# Patient Record
Sex: Female | Born: 1935 | Race: White | Hispanic: No | Marital: Single | Smoking: Never smoker
Health system: Southern US, Community
[De-identification: ages and names within clinical notes are randomized; demographics above are authoritative.]

## PROBLEM LIST (undated history)

## (undated) DIAGNOSIS — K5792 Diverticulitis of intestine, part unspecified, without perforation or abscess without bleeding: Secondary | ICD-10-CM

## (undated) DIAGNOSIS — M5430 Sciatica, unspecified side: Secondary | ICD-10-CM

## (undated) DIAGNOSIS — K5190 Ulcerative colitis, unspecified, without complications: Secondary | ICD-10-CM

## (undated) DIAGNOSIS — I10 Essential (primary) hypertension: Secondary | ICD-10-CM

## (undated) DIAGNOSIS — C801 Malignant (primary) neoplasm, unspecified: Secondary | ICD-10-CM

## (undated) DIAGNOSIS — L719 Rosacea, unspecified: Secondary | ICD-10-CM

## (undated) DIAGNOSIS — N200 Calculus of kidney: Secondary | ICD-10-CM

## (undated) HISTORY — DX: Malignant (primary) neoplasm, unspecified: C80.1

## (undated) HISTORY — PX: ABDOMINAL HYSTERECTOMY: SHX81

## (undated) HISTORY — PX: EYE SURGERY: SHX253

## (undated) HISTORY — PX: MASTECTOMY: SHX3

## (undated) HISTORY — PX: BREAST SURGERY: SHX581

---

## 2006-03-04 ENCOUNTER — Ambulatory Visit: Payer: Self-pay | Admitting: Gastroenterology

## 2006-12-09 ENCOUNTER — Ambulatory Visit: Payer: Self-pay | Admitting: Family Medicine

## 2007-11-24 ENCOUNTER — Ambulatory Visit: Payer: Self-pay | Admitting: Family Medicine

## 2007-12-23 ENCOUNTER — Ambulatory Visit: Payer: Self-pay | Admitting: Family Medicine

## 2008-02-10 ENCOUNTER — Emergency Department: Payer: Self-pay | Admitting: Emergency Medicine

## 2008-09-13 ENCOUNTER — Ambulatory Visit: Payer: Self-pay | Admitting: Internal Medicine

## 2008-10-11 ENCOUNTER — Inpatient Hospital Stay: Payer: Self-pay | Admitting: Internal Medicine

## 2008-12-24 ENCOUNTER — Ambulatory Visit: Payer: Self-pay | Admitting: Family Medicine

## 2009-02-25 ENCOUNTER — Ambulatory Visit: Payer: Self-pay | Admitting: Family Medicine

## 2009-05-10 ENCOUNTER — Ambulatory Visit: Payer: Self-pay | Admitting: Family Medicine

## 2009-07-30 ENCOUNTER — Encounter: Payer: Self-pay | Admitting: Orthopedic Surgery

## 2009-08-07 ENCOUNTER — Encounter: Payer: Self-pay | Admitting: Orthopedic Surgery

## 2009-09-07 ENCOUNTER — Encounter: Payer: Self-pay | Admitting: Orthopedic Surgery

## 2009-10-08 ENCOUNTER — Encounter: Payer: Self-pay | Admitting: Orthopedic Surgery

## 2009-11-05 ENCOUNTER — Encounter: Payer: Self-pay | Admitting: Orthopedic Surgery

## 2009-12-06 ENCOUNTER — Encounter: Payer: Self-pay | Admitting: Orthopedic Surgery

## 2009-12-14 ENCOUNTER — Ambulatory Visit: Payer: Self-pay | Admitting: Family Medicine

## 2010-01-05 ENCOUNTER — Encounter: Payer: Self-pay | Admitting: Orthopedic Surgery

## 2010-08-13 ENCOUNTER — Other Ambulatory Visit: Payer: Self-pay | Admitting: Physician Assistant

## 2010-08-26 ENCOUNTER — Other Ambulatory Visit: Payer: Self-pay | Admitting: Gastroenterology

## 2011-05-07 ENCOUNTER — Ambulatory Visit: Payer: Self-pay | Admitting: Family Medicine

## 2011-09-09 DIAGNOSIS — M9981 Other biomechanical lesions of cervical region: Secondary | ICD-10-CM | POA: Diagnosis not present

## 2011-09-09 DIAGNOSIS — M729 Fibroblastic disorder, unspecified: Secondary | ICD-10-CM | POA: Diagnosis not present

## 2011-09-09 DIAGNOSIS — M542 Cervicalgia: Secondary | ICD-10-CM | POA: Diagnosis not present

## 2011-09-09 DIAGNOSIS — M999 Biomechanical lesion, unspecified: Secondary | ICD-10-CM | POA: Diagnosis not present

## 2011-09-18 ENCOUNTER — Ambulatory Visit: Payer: Self-pay

## 2011-09-18 DIAGNOSIS — S59909A Unspecified injury of unspecified elbow, initial encounter: Secondary | ICD-10-CM | POA: Diagnosis not present

## 2011-09-18 DIAGNOSIS — S6990XA Unspecified injury of unspecified wrist, hand and finger(s), initial encounter: Secondary | ICD-10-CM | POA: Diagnosis not present

## 2011-09-19 ENCOUNTER — Ambulatory Visit: Payer: Self-pay

## 2011-09-25 DIAGNOSIS — R259 Unspecified abnormal involuntary movements: Secondary | ICD-10-CM | POA: Diagnosis not present

## 2011-09-25 DIAGNOSIS — Z853 Personal history of malignant neoplasm of breast: Secondary | ICD-10-CM | POA: Diagnosis not present

## 2011-09-25 DIAGNOSIS — R269 Unspecified abnormalities of gait and mobility: Secondary | ICD-10-CM | POA: Diagnosis not present

## 2011-09-25 DIAGNOSIS — M542 Cervicalgia: Secondary | ICD-10-CM | POA: Diagnosis not present

## 2011-09-25 DIAGNOSIS — M9981 Other biomechanical lesions of cervical region: Secondary | ICD-10-CM | POA: Diagnosis not present

## 2011-09-25 DIAGNOSIS — M999 Biomechanical lesion, unspecified: Secondary | ICD-10-CM | POA: Diagnosis not present

## 2011-09-25 DIAGNOSIS — Z79899 Other long term (current) drug therapy: Secondary | ICD-10-CM | POA: Diagnosis not present

## 2011-09-25 DIAGNOSIS — M729 Fibroblastic disorder, unspecified: Secondary | ICD-10-CM | POA: Diagnosis not present

## 2011-09-25 DIAGNOSIS — I69919 Unspecified symptoms and signs involving cognitive functions following unspecified cerebrovascular disease: Secondary | ICD-10-CM | POA: Diagnosis not present

## 2011-09-25 DIAGNOSIS — G250 Essential tremor: Secondary | ICD-10-CM | POA: Diagnosis not present

## 2011-09-26 DIAGNOSIS — M899 Disorder of bone, unspecified: Secondary | ICD-10-CM | POA: Diagnosis not present

## 2011-09-26 DIAGNOSIS — C50919 Malignant neoplasm of unspecified site of unspecified female breast: Secondary | ICD-10-CM | POA: Diagnosis not present

## 2011-09-26 DIAGNOSIS — Z79899 Other long term (current) drug therapy: Secondary | ICD-10-CM | POA: Diagnosis not present

## 2011-09-26 DIAGNOSIS — I10 Essential (primary) hypertension: Secondary | ICD-10-CM | POA: Diagnosis not present

## 2011-09-26 DIAGNOSIS — I890 Lymphedema, not elsewhere classified: Secondary | ICD-10-CM | POA: Diagnosis not present

## 2011-09-26 DIAGNOSIS — Z5181 Encounter for therapeutic drug level monitoring: Secondary | ICD-10-CM | POA: Diagnosis not present

## 2011-09-26 DIAGNOSIS — M949 Disorder of cartilage, unspecified: Secondary | ICD-10-CM | POA: Diagnosis not present

## 2011-09-26 DIAGNOSIS — Z09 Encounter for follow-up examination after completed treatment for conditions other than malignant neoplasm: Secondary | ICD-10-CM | POA: Diagnosis not present

## 2011-09-26 DIAGNOSIS — S51809A Unspecified open wound of unspecified forearm, initial encounter: Secondary | ICD-10-CM | POA: Diagnosis not present

## 2011-09-30 DIAGNOSIS — L0390 Cellulitis, unspecified: Secondary | ICD-10-CM | POA: Diagnosis not present

## 2011-09-30 DIAGNOSIS — S51809A Unspecified open wound of unspecified forearm, initial encounter: Secondary | ICD-10-CM | POA: Diagnosis not present

## 2011-09-30 DIAGNOSIS — M949 Disorder of cartilage, unspecified: Secondary | ICD-10-CM | POA: Diagnosis not present

## 2011-09-30 DIAGNOSIS — S41109A Unspecified open wound of unspecified upper arm, initial encounter: Secondary | ICD-10-CM | POA: Diagnosis not present

## 2011-09-30 DIAGNOSIS — L0291 Cutaneous abscess, unspecified: Secondary | ICD-10-CM | POA: Diagnosis not present

## 2011-10-14 ENCOUNTER — Ambulatory Visit: Payer: Self-pay | Admitting: Family Medicine

## 2011-10-14 DIAGNOSIS — Z79899 Other long term (current) drug therapy: Secondary | ICD-10-CM | POA: Diagnosis not present

## 2011-10-14 DIAGNOSIS — I10 Essential (primary) hypertension: Secondary | ICD-10-CM | POA: Diagnosis not present

## 2011-10-14 LAB — LIPID PANEL
Cholesterol: 186 mg/dL (ref 0–200)
HDL Cholesterol: 69 mg/dL — ABNORMAL HIGH (ref 40–60)
Triglycerides: 121 mg/dL (ref 0–200)
VLDL Cholesterol, Calc: 24 mg/dL (ref 5–40)

## 2011-10-14 LAB — CREATININE, SERUM
Creatinine: 1.09 mg/dL (ref 0.60–1.30)
EGFR (African American): >60

## 2011-10-14 LAB — ELECTROLYTE PANEL
Anion Gap: 7 (ref 7–16)
Co2: 33 mmol/L — ABNORMAL HIGH (ref 21–32)

## 2011-10-14 LAB — BUN: BUN: 19 mg/dL — ABNORMAL HIGH (ref 7–18)

## 2011-11-03 ENCOUNTER — Ambulatory Visit: Payer: Self-pay | Admitting: Family Medicine

## 2011-11-03 DIAGNOSIS — M899 Disorder of bone, unspecified: Secondary | ICD-10-CM | POA: Diagnosis not present

## 2011-11-03 DIAGNOSIS — N951 Menopausal and female climacteric states: Secondary | ICD-10-CM | POA: Diagnosis not present

## 2011-11-03 DIAGNOSIS — M810 Age-related osteoporosis without current pathological fracture: Secondary | ICD-10-CM | POA: Diagnosis not present

## 2011-11-05 DIAGNOSIS — D320 Benign neoplasm of cerebral meninges: Secondary | ICD-10-CM | POA: Diagnosis not present

## 2011-11-05 DIAGNOSIS — G250 Essential tremor: Secondary | ICD-10-CM | POA: Diagnosis not present

## 2011-11-05 DIAGNOSIS — M4802 Spinal stenosis, cervical region: Secondary | ICD-10-CM | POA: Diagnosis not present

## 2011-11-05 DIAGNOSIS — G252 Other specified forms of tremor: Secondary | ICD-10-CM | POA: Diagnosis not present

## 2011-11-05 DIAGNOSIS — G219 Secondary parkinsonism, unspecified: Secondary | ICD-10-CM | POA: Diagnosis not present

## 2011-12-03 DIAGNOSIS — M546 Pain in thoracic spine: Secondary | ICD-10-CM | POA: Diagnosis not present

## 2011-12-03 DIAGNOSIS — M729 Fibroblastic disorder, unspecified: Secondary | ICD-10-CM | POA: Diagnosis not present

## 2011-12-03 DIAGNOSIS — M999 Biomechanical lesion, unspecified: Secondary | ICD-10-CM | POA: Diagnosis not present

## 2011-12-24 DIAGNOSIS — G3184 Mild cognitive impairment, so stated: Secondary | ICD-10-CM | POA: Diagnosis not present

## 2011-12-24 DIAGNOSIS — R259 Unspecified abnormal involuntary movements: Secondary | ICD-10-CM | POA: Diagnosis not present

## 2011-12-24 DIAGNOSIS — R269 Unspecified abnormalities of gait and mobility: Secondary | ICD-10-CM | POA: Diagnosis not present

## 2011-12-25 DIAGNOSIS — M729 Fibroblastic disorder, unspecified: Secondary | ICD-10-CM | POA: Diagnosis not present

## 2011-12-25 DIAGNOSIS — M546 Pain in thoracic spine: Secondary | ICD-10-CM | POA: Diagnosis not present

## 2011-12-25 DIAGNOSIS — M999 Biomechanical lesion, unspecified: Secondary | ICD-10-CM | POA: Diagnosis not present

## 2012-01-07 DIAGNOSIS — M546 Pain in thoracic spine: Secondary | ICD-10-CM | POA: Diagnosis not present

## 2012-01-07 DIAGNOSIS — M999 Biomechanical lesion, unspecified: Secondary | ICD-10-CM | POA: Diagnosis not present

## 2012-01-07 DIAGNOSIS — M729 Fibroblastic disorder, unspecified: Secondary | ICD-10-CM | POA: Diagnosis not present

## 2012-01-11 DIAGNOSIS — H353190 Nonexudative age-related macular degeneration, unspecified eye, stage unspecified: Secondary | ICD-10-CM | POA: Diagnosis not present

## 2012-01-11 DIAGNOSIS — H16149 Punctate keratitis, unspecified eye: Secondary | ICD-10-CM | POA: Diagnosis not present

## 2012-01-27 DIAGNOSIS — M546 Pain in thoracic spine: Secondary | ICD-10-CM | POA: Diagnosis not present

## 2012-01-27 DIAGNOSIS — M729 Fibroblastic disorder, unspecified: Secondary | ICD-10-CM | POA: Diagnosis not present

## 2012-01-27 DIAGNOSIS — M999 Biomechanical lesion, unspecified: Secondary | ICD-10-CM | POA: Diagnosis not present

## 2012-01-29 DIAGNOSIS — C50919 Malignant neoplasm of unspecified site of unspecified female breast: Secondary | ICD-10-CM | POA: Diagnosis not present

## 2012-01-29 DIAGNOSIS — N390 Urinary tract infection, site not specified: Secondary | ICD-10-CM | POA: Diagnosis not present

## 2012-01-29 DIAGNOSIS — R5381 Other malaise: Secondary | ICD-10-CM | POA: Diagnosis not present

## 2012-01-29 DIAGNOSIS — M549 Dorsalgia, unspecified: Secondary | ICD-10-CM | POA: Diagnosis not present

## 2012-02-08 DIAGNOSIS — L0390 Cellulitis, unspecified: Secondary | ICD-10-CM | POA: Diagnosis not present

## 2012-02-08 DIAGNOSIS — L0291 Cutaneous abscess, unspecified: Secondary | ICD-10-CM | POA: Diagnosis not present

## 2012-02-11 DIAGNOSIS — M545 Low back pain: Secondary | ICD-10-CM | POA: Diagnosis not present

## 2012-02-11 DIAGNOSIS — M999 Biomechanical lesion, unspecified: Secondary | ICD-10-CM | POA: Diagnosis not present

## 2012-02-11 DIAGNOSIS — M729 Fibroblastic disorder, unspecified: Secondary | ICD-10-CM | POA: Diagnosis not present

## 2012-03-01 DIAGNOSIS — M729 Fibroblastic disorder, unspecified: Secondary | ICD-10-CM | POA: Diagnosis not present

## 2012-03-01 DIAGNOSIS — M999 Biomechanical lesion, unspecified: Secondary | ICD-10-CM | POA: Diagnosis not present

## 2012-03-01 DIAGNOSIS — M545 Low back pain: Secondary | ICD-10-CM | POA: Diagnosis not present

## 2012-03-25 DIAGNOSIS — M729 Fibroblastic disorder, unspecified: Secondary | ICD-10-CM | POA: Diagnosis not present

## 2012-03-25 DIAGNOSIS — M545 Low back pain: Secondary | ICD-10-CM | POA: Diagnosis not present

## 2012-03-25 DIAGNOSIS — M999 Biomechanical lesion, unspecified: Secondary | ICD-10-CM | POA: Diagnosis not present

## 2012-04-18 DIAGNOSIS — M729 Fibroblastic disorder, unspecified: Secondary | ICD-10-CM | POA: Diagnosis not present

## 2012-04-18 DIAGNOSIS — M999 Biomechanical lesion, unspecified: Secondary | ICD-10-CM | POA: Diagnosis not present

## 2012-04-18 DIAGNOSIS — IMO0002 Reserved for concepts with insufficient information to code with codable children: Secondary | ICD-10-CM | POA: Diagnosis not present

## 2012-05-11 DIAGNOSIS — C50919 Malignant neoplasm of unspecified site of unspecified female breast: Secondary | ICD-10-CM | POA: Diagnosis not present

## 2012-05-16 ENCOUNTER — Ambulatory Visit: Payer: Self-pay | Admitting: Family Medicine

## 2012-05-16 DIAGNOSIS — I10 Essential (primary) hypertension: Secondary | ICD-10-CM | POA: Diagnosis not present

## 2012-05-16 LAB — RENAL FUNCTION PANEL
Albumin: 3.7 g/dL (ref 3.4–5.0)
BUN: 24 mg/dL — ABNORMAL HIGH (ref 7–18)
Calcium, Total: 9.7 mg/dL (ref 8.5–10.1)
Chloride: 103 mmol/L (ref 98–107)
Co2: 34 mmol/L — ABNORMAL HIGH (ref 21–32)
Creatinine: 1.23 mg/dL (ref 0.60–1.30)
EGFR (Non-African Amer.): 39 — ABNORMAL LOW
Glucose: 105 mg/dL — ABNORMAL HIGH (ref 65–99)
Osmolality: 286 (ref 275–301)
Phosphorus: 3.1 mg/dL (ref 2.5–4.9)

## 2012-06-16 DIAGNOSIS — Z23 Encounter for immunization: Secondary | ICD-10-CM | POA: Diagnosis not present

## 2012-06-21 DIAGNOSIS — M999 Biomechanical lesion, unspecified: Secondary | ICD-10-CM | POA: Diagnosis not present

## 2012-06-21 DIAGNOSIS — M729 Fibroblastic disorder, unspecified: Secondary | ICD-10-CM | POA: Diagnosis not present

## 2012-06-21 DIAGNOSIS — M546 Pain in thoracic spine: Secondary | ICD-10-CM | POA: Diagnosis not present

## 2012-06-29 DIAGNOSIS — L259 Unspecified contact dermatitis, unspecified cause: Secondary | ICD-10-CM | POA: Diagnosis not present

## 2012-06-29 DIAGNOSIS — L723 Sebaceous cyst: Secondary | ICD-10-CM | POA: Diagnosis not present

## 2012-06-29 DIAGNOSIS — L570 Actinic keratosis: Secondary | ICD-10-CM | POA: Diagnosis not present

## 2012-06-29 DIAGNOSIS — L219 Seborrheic dermatitis, unspecified: Secondary | ICD-10-CM | POA: Diagnosis not present

## 2012-07-06 DIAGNOSIS — M999 Biomechanical lesion, unspecified: Secondary | ICD-10-CM | POA: Diagnosis not present

## 2012-07-06 DIAGNOSIS — M729 Fibroblastic disorder, unspecified: Secondary | ICD-10-CM | POA: Diagnosis not present

## 2012-07-06 DIAGNOSIS — M546 Pain in thoracic spine: Secondary | ICD-10-CM | POA: Diagnosis not present

## 2012-07-14 DIAGNOSIS — H1045 Other chronic allergic conjunctivitis: Secondary | ICD-10-CM | POA: Diagnosis not present

## 2012-07-15 DIAGNOSIS — R29818 Other symptoms and signs involving the nervous system: Secondary | ICD-10-CM | POA: Diagnosis not present

## 2012-07-15 DIAGNOSIS — R262 Difficulty in walking, not elsewhere classified: Secondary | ICD-10-CM | POA: Diagnosis not present

## 2012-07-21 DIAGNOSIS — H1045 Other chronic allergic conjunctivitis: Secondary | ICD-10-CM | POA: Diagnosis not present

## 2012-07-27 DIAGNOSIS — L259 Unspecified contact dermatitis, unspecified cause: Secondary | ICD-10-CM | POA: Diagnosis not present

## 2012-07-27 DIAGNOSIS — B379 Candidiasis, unspecified: Secondary | ICD-10-CM | POA: Diagnosis not present

## 2012-07-27 DIAGNOSIS — N390 Urinary tract infection, site not specified: Secondary | ICD-10-CM | POA: Diagnosis not present

## 2012-07-29 DIAGNOSIS — R29818 Other symptoms and signs involving the nervous system: Secondary | ICD-10-CM | POA: Diagnosis not present

## 2012-07-29 DIAGNOSIS — R262 Difficulty in walking, not elsewhere classified: Secondary | ICD-10-CM | POA: Diagnosis not present

## 2012-08-09 DIAGNOSIS — R262 Difficulty in walking, not elsewhere classified: Secondary | ICD-10-CM | POA: Diagnosis not present

## 2012-08-09 DIAGNOSIS — R29818 Other symptoms and signs involving the nervous system: Secondary | ICD-10-CM | POA: Diagnosis not present

## 2012-08-16 DIAGNOSIS — R262 Difficulty in walking, not elsewhere classified: Secondary | ICD-10-CM | POA: Diagnosis not present

## 2012-08-16 DIAGNOSIS — R29818 Other symptoms and signs involving the nervous system: Secondary | ICD-10-CM | POA: Diagnosis not present

## 2012-09-13 DIAGNOSIS — R262 Difficulty in walking, not elsewhere classified: Secondary | ICD-10-CM | POA: Diagnosis not present

## 2012-09-13 DIAGNOSIS — R29818 Other symptoms and signs involving the nervous system: Secondary | ICD-10-CM | POA: Diagnosis not present

## 2012-09-20 DIAGNOSIS — H35369 Drusen (degenerative) of macula, unspecified eye: Secondary | ICD-10-CM | POA: Diagnosis not present

## 2012-09-22 DIAGNOSIS — L989 Disorder of the skin and subcutaneous tissue, unspecified: Secondary | ICD-10-CM | POA: Diagnosis not present

## 2012-09-22 DIAGNOSIS — C50919 Malignant neoplasm of unspecified site of unspecified female breast: Secondary | ICD-10-CM | POA: Diagnosis not present

## 2012-10-06 DIAGNOSIS — L600 Ingrowing nail: Secondary | ICD-10-CM | POA: Diagnosis not present

## 2012-10-06 DIAGNOSIS — M79609 Pain in unspecified limb: Secondary | ICD-10-CM | POA: Diagnosis not present

## 2012-10-06 DIAGNOSIS — B351 Tinea unguium: Secondary | ICD-10-CM | POA: Diagnosis not present

## 2012-10-17 DIAGNOSIS — R079 Chest pain, unspecified: Secondary | ICD-10-CM | POA: Diagnosis not present

## 2012-10-17 DIAGNOSIS — R4189 Other symptoms and signs involving cognitive functions and awareness: Secondary | ICD-10-CM | POA: Diagnosis not present

## 2012-10-17 DIAGNOSIS — G5790 Unspecified mononeuropathy of unspecified lower limb: Secondary | ICD-10-CM | POA: Diagnosis not present

## 2012-10-19 DIAGNOSIS — G603 Idiopathic progressive neuropathy: Secondary | ICD-10-CM | POA: Diagnosis not present

## 2012-10-19 DIAGNOSIS — R413 Other amnesia: Secondary | ICD-10-CM | POA: Diagnosis not present

## 2012-10-25 DIAGNOSIS — R209 Unspecified disturbances of skin sensation: Secondary | ICD-10-CM | POA: Diagnosis not present

## 2012-11-01 DIAGNOSIS — Z1283 Encounter for screening for malignant neoplasm of skin: Secondary | ICD-10-CM | POA: Diagnosis not present

## 2012-11-01 DIAGNOSIS — L988 Other specified disorders of the skin and subcutaneous tissue: Secondary | ICD-10-CM | POA: Diagnosis not present

## 2012-11-01 DIAGNOSIS — Z85828 Personal history of other malignant neoplasm of skin: Secondary | ICD-10-CM | POA: Diagnosis not present

## 2012-11-01 DIAGNOSIS — G9009 Other idiopathic peripheral autonomic neuropathy: Secondary | ICD-10-CM | POA: Diagnosis not present

## 2013-01-18 DIAGNOSIS — G609 Hereditary and idiopathic neuropathy, unspecified: Secondary | ICD-10-CM | POA: Diagnosis not present

## 2013-02-09 DIAGNOSIS — L219 Seborrheic dermatitis, unspecified: Secondary | ICD-10-CM | POA: Diagnosis not present

## 2013-02-09 DIAGNOSIS — L570 Actinic keratosis: Secondary | ICD-10-CM | POA: Diagnosis not present

## 2013-02-09 DIAGNOSIS — L259 Unspecified contact dermatitis, unspecified cause: Secondary | ICD-10-CM | POA: Diagnosis not present

## 2013-03-16 DIAGNOSIS — G608 Other hereditary and idiopathic neuropathies: Secondary | ICD-10-CM | POA: Diagnosis not present

## 2013-03-16 DIAGNOSIS — R5381 Other malaise: Secondary | ICD-10-CM | POA: Diagnosis not present

## 2013-03-16 DIAGNOSIS — G609 Hereditary and idiopathic neuropathy, unspecified: Secondary | ICD-10-CM | POA: Diagnosis not present

## 2013-03-20 DIAGNOSIS — M899 Disorder of bone, unspecified: Secondary | ICD-10-CM | POA: Diagnosis not present

## 2013-03-20 DIAGNOSIS — C50919 Malignant neoplasm of unspecified site of unspecified female breast: Secondary | ICD-10-CM | POA: Diagnosis not present

## 2013-03-30 DIAGNOSIS — M999 Biomechanical lesion, unspecified: Secondary | ICD-10-CM | POA: Diagnosis not present

## 2013-03-30 DIAGNOSIS — M729 Fibroblastic disorder, unspecified: Secondary | ICD-10-CM | POA: Diagnosis not present

## 2013-03-30 DIAGNOSIS — IMO0002 Reserved for concepts with insufficient information to code with codable children: Secondary | ICD-10-CM | POA: Diagnosis not present

## 2013-04-03 ENCOUNTER — Ambulatory Visit: Payer: Self-pay | Admitting: Emergency Medicine

## 2013-04-03 DIAGNOSIS — Z79899 Other long term (current) drug therapy: Secondary | ICD-10-CM | POA: Diagnosis not present

## 2013-04-03 DIAGNOSIS — S81009A Unspecified open wound, unspecified knee, initial encounter: Secondary | ICD-10-CM | POA: Diagnosis not present

## 2013-04-03 DIAGNOSIS — I10 Essential (primary) hypertension: Secondary | ICD-10-CM | POA: Diagnosis not present

## 2013-04-03 DIAGNOSIS — S81809A Unspecified open wound, unspecified lower leg, initial encounter: Secondary | ICD-10-CM | POA: Diagnosis not present

## 2013-04-21 DIAGNOSIS — M999 Biomechanical lesion, unspecified: Secondary | ICD-10-CM | POA: Diagnosis not present

## 2013-04-21 DIAGNOSIS — M729 Fibroblastic disorder, unspecified: Secondary | ICD-10-CM | POA: Diagnosis not present

## 2013-04-21 DIAGNOSIS — IMO0002 Reserved for concepts with insufficient information to code with codable children: Secondary | ICD-10-CM | POA: Diagnosis not present

## 2013-05-10 DIAGNOSIS — IMO0002 Reserved for concepts with insufficient information to code with codable children: Secondary | ICD-10-CM | POA: Diagnosis not present

## 2013-05-10 DIAGNOSIS — M999 Biomechanical lesion, unspecified: Secondary | ICD-10-CM | POA: Diagnosis not present

## 2013-05-10 DIAGNOSIS — M729 Fibroblastic disorder, unspecified: Secondary | ICD-10-CM | POA: Diagnosis not present

## 2013-05-19 ENCOUNTER — Ambulatory Visit: Payer: Self-pay | Admitting: Family Medicine

## 2013-05-19 DIAGNOSIS — I10 Essential (primary) hypertension: Secondary | ICD-10-CM | POA: Diagnosis not present

## 2013-05-19 DIAGNOSIS — N390 Urinary tract infection, site not specified: Secondary | ICD-10-CM | POA: Diagnosis not present

## 2013-05-19 LAB — RENAL FUNCTION PANEL
Anion Gap: 7 (ref 7–16)
BUN: 15 mg/dL (ref 7–18)
Co2: 32 mmol/L (ref 21–32)
Creatinine: 1.01 mg/dL (ref 0.60–1.30)
Glucose: 88 mg/dL (ref 65–99)
Osmolality: 287 (ref 275–301)

## 2013-06-08 DIAGNOSIS — M999 Biomechanical lesion, unspecified: Secondary | ICD-10-CM | POA: Diagnosis not present

## 2013-06-08 DIAGNOSIS — M729 Fibroblastic disorder, unspecified: Secondary | ICD-10-CM | POA: Diagnosis not present

## 2013-06-08 DIAGNOSIS — M545 Low back pain: Secondary | ICD-10-CM | POA: Diagnosis not present

## 2013-06-09 DIAGNOSIS — Z23 Encounter for immunization: Secondary | ICD-10-CM | POA: Diagnosis not present

## 2013-07-13 DIAGNOSIS — M729 Fibroblastic disorder, unspecified: Secondary | ICD-10-CM | POA: Diagnosis not present

## 2013-07-13 DIAGNOSIS — M545 Low back pain: Secondary | ICD-10-CM | POA: Diagnosis not present

## 2013-07-13 DIAGNOSIS — M999 Biomechanical lesion, unspecified: Secondary | ICD-10-CM | POA: Diagnosis not present

## 2013-07-21 DIAGNOSIS — M545 Low back pain: Secondary | ICD-10-CM | POA: Diagnosis not present

## 2013-07-21 DIAGNOSIS — M549 Dorsalgia, unspecified: Secondary | ICD-10-CM | POA: Diagnosis not present

## 2013-07-25 ENCOUNTER — Ambulatory Visit: Payer: Self-pay | Admitting: Orthopedic Surgery

## 2013-07-25 DIAGNOSIS — M545 Low back pain, unspecified: Secondary | ICD-10-CM | POA: Diagnosis not present

## 2013-08-01 DIAGNOSIS — R262 Difficulty in walking, not elsewhere classified: Secondary | ICD-10-CM | POA: Diagnosis not present

## 2013-08-01 DIAGNOSIS — M48061 Spinal stenosis, lumbar region without neurogenic claudication: Secondary | ICD-10-CM | POA: Diagnosis not present

## 2013-08-07 DIAGNOSIS — M48061 Spinal stenosis, lumbar region without neurogenic claudication: Secondary | ICD-10-CM | POA: Diagnosis not present

## 2013-08-07 DIAGNOSIS — R262 Difficulty in walking, not elsewhere classified: Secondary | ICD-10-CM | POA: Diagnosis not present

## 2013-08-08 DIAGNOSIS — M48061 Spinal stenosis, lumbar region without neurogenic claudication: Secondary | ICD-10-CM | POA: Diagnosis not present

## 2013-08-08 DIAGNOSIS — R262 Difficulty in walking, not elsewhere classified: Secondary | ICD-10-CM | POA: Diagnosis not present

## 2013-08-11 DIAGNOSIS — R262 Difficulty in walking, not elsewhere classified: Secondary | ICD-10-CM | POA: Diagnosis not present

## 2013-08-11 DIAGNOSIS — M48061 Spinal stenosis, lumbar region without neurogenic claudication: Secondary | ICD-10-CM | POA: Diagnosis not present

## 2013-08-17 DIAGNOSIS — M48061 Spinal stenosis, lumbar region without neurogenic claudication: Secondary | ICD-10-CM | POA: Diagnosis not present

## 2013-08-17 DIAGNOSIS — R262 Difficulty in walking, not elsewhere classified: Secondary | ICD-10-CM | POA: Diagnosis not present

## 2013-08-21 DIAGNOSIS — R262 Difficulty in walking, not elsewhere classified: Secondary | ICD-10-CM | POA: Diagnosis not present

## 2013-08-21 DIAGNOSIS — M48061 Spinal stenosis, lumbar region without neurogenic claudication: Secondary | ICD-10-CM | POA: Diagnosis not present

## 2013-08-24 DIAGNOSIS — R262 Difficulty in walking, not elsewhere classified: Secondary | ICD-10-CM | POA: Diagnosis not present

## 2013-08-24 DIAGNOSIS — M48061 Spinal stenosis, lumbar region without neurogenic claudication: Secondary | ICD-10-CM | POA: Diagnosis not present

## 2013-08-28 DIAGNOSIS — M48061 Spinal stenosis, lumbar region without neurogenic claudication: Secondary | ICD-10-CM | POA: Diagnosis not present

## 2013-08-28 DIAGNOSIS — R262 Difficulty in walking, not elsewhere classified: Secondary | ICD-10-CM | POA: Diagnosis not present

## 2013-09-27 DIAGNOSIS — Z9010 Acquired absence of unspecified breast and nipple: Secondary | ICD-10-CM | POA: Diagnosis not present

## 2013-09-27 DIAGNOSIS — M949 Disorder of cartilage, unspecified: Secondary | ICD-10-CM | POA: Diagnosis not present

## 2013-09-27 DIAGNOSIS — R413 Other amnesia: Secondary | ICD-10-CM | POA: Diagnosis not present

## 2013-09-27 DIAGNOSIS — R259 Unspecified abnormal involuntary movements: Secondary | ICD-10-CM | POA: Diagnosis not present

## 2013-09-27 DIAGNOSIS — Z170 Estrogen receptor positive status [ER+]: Secondary | ICD-10-CM | POA: Diagnosis not present

## 2013-09-27 DIAGNOSIS — M549 Dorsalgia, unspecified: Secondary | ICD-10-CM | POA: Diagnosis not present

## 2013-09-27 DIAGNOSIS — R3915 Urgency of urination: Secondary | ICD-10-CM | POA: Diagnosis not present

## 2013-09-27 DIAGNOSIS — M899 Disorder of bone, unspecified: Secondary | ICD-10-CM | POA: Diagnosis not present

## 2013-09-27 DIAGNOSIS — G20 Parkinson's disease: Secondary | ICD-10-CM | POA: Diagnosis not present

## 2013-09-27 DIAGNOSIS — N3941 Urge incontinence: Secondary | ICD-10-CM | POA: Diagnosis not present

## 2013-09-27 DIAGNOSIS — C50919 Malignant neoplasm of unspecified site of unspecified female breast: Secondary | ICD-10-CM | POA: Diagnosis not present

## 2013-09-27 DIAGNOSIS — Z79899 Other long term (current) drug therapy: Secondary | ICD-10-CM | POA: Diagnosis not present

## 2013-10-04 DIAGNOSIS — Z9010 Acquired absence of unspecified breast and nipple: Secondary | ICD-10-CM | POA: Diagnosis not present

## 2013-10-04 DIAGNOSIS — C50919 Malignant neoplasm of unspecified site of unspecified female breast: Secondary | ICD-10-CM | POA: Diagnosis not present

## 2013-10-04 DIAGNOSIS — Z170 Estrogen receptor positive status [ER+]: Secondary | ICD-10-CM | POA: Diagnosis not present

## 2013-10-04 DIAGNOSIS — N3941 Urge incontinence: Secondary | ICD-10-CM | POA: Diagnosis not present

## 2013-10-04 DIAGNOSIS — Z853 Personal history of malignant neoplasm of breast: Secondary | ICD-10-CM | POA: Diagnosis not present

## 2013-10-04 DIAGNOSIS — M549 Dorsalgia, unspecified: Secondary | ICD-10-CM | POA: Diagnosis not present

## 2013-10-04 DIAGNOSIS — R413 Other amnesia: Secondary | ICD-10-CM | POA: Diagnosis not present

## 2013-10-04 DIAGNOSIS — R935 Abnormal findings on diagnostic imaging of other abdominal regions, including retroperitoneum: Secondary | ICD-10-CM | POA: Diagnosis not present

## 2013-10-25 DIAGNOSIS — L2089 Other atopic dermatitis: Secondary | ICD-10-CM | POA: Diagnosis not present

## 2013-10-25 DIAGNOSIS — G9009 Other idiopathic peripheral autonomic neuropathy: Secondary | ICD-10-CM | POA: Diagnosis not present

## 2013-10-30 ENCOUNTER — Ambulatory Visit: Payer: Self-pay | Admitting: Physician Assistant

## 2013-10-30 DIAGNOSIS — M533 Sacrococcygeal disorders, not elsewhere classified: Secondary | ICD-10-CM | POA: Diagnosis not present

## 2013-10-30 DIAGNOSIS — S322XXA Fracture of coccyx, initial encounter for closed fracture: Secondary | ICD-10-CM | POA: Diagnosis not present

## 2013-10-30 DIAGNOSIS — S3210XA Unspecified fracture of sacrum, initial encounter for closed fracture: Secondary | ICD-10-CM | POA: Diagnosis not present

## 2013-10-30 DIAGNOSIS — Z79899 Other long term (current) drug therapy: Secondary | ICD-10-CM | POA: Diagnosis not present

## 2013-10-30 DIAGNOSIS — IMO0002 Reserved for concepts with insufficient information to code with codable children: Secondary | ICD-10-CM | POA: Diagnosis not present

## 2013-10-30 DIAGNOSIS — I10 Essential (primary) hypertension: Secondary | ICD-10-CM | POA: Diagnosis not present

## 2013-11-07 DIAGNOSIS — IMO0002 Reserved for concepts with insufficient information to code with codable children: Secondary | ICD-10-CM | POA: Diagnosis not present

## 2013-11-23 DIAGNOSIS — M1610 Unilateral primary osteoarthritis, unspecified hip: Secondary | ICD-10-CM | POA: Diagnosis not present

## 2013-11-23 DIAGNOSIS — M169 Osteoarthritis of hip, unspecified: Secondary | ICD-10-CM | POA: Diagnosis not present

## 2013-11-23 DIAGNOSIS — Z853 Personal history of malignant neoplasm of breast: Secondary | ICD-10-CM | POA: Diagnosis not present

## 2013-11-23 DIAGNOSIS — M549 Dorsalgia, unspecified: Secondary | ICD-10-CM | POA: Diagnosis not present

## 2013-11-23 DIAGNOSIS — M47814 Spondylosis without myelopathy or radiculopathy, thoracic region: Secondary | ICD-10-CM | POA: Diagnosis not present

## 2013-11-23 DIAGNOSIS — M25859 Other specified joint disorders, unspecified hip: Secondary | ICD-10-CM | POA: Diagnosis not present

## 2013-11-23 DIAGNOSIS — C50919 Malignant neoplasm of unspecified site of unspecified female breast: Secondary | ICD-10-CM | POA: Diagnosis not present

## 2013-11-23 DIAGNOSIS — R937 Abnormal findings on diagnostic imaging of other parts of musculoskeletal system: Secondary | ICD-10-CM | POA: Diagnosis not present

## 2013-11-23 DIAGNOSIS — M25559 Pain in unspecified hip: Secondary | ICD-10-CM | POA: Diagnosis not present

## 2013-11-23 DIAGNOSIS — R3915 Urgency of urination: Secondary | ICD-10-CM | POA: Diagnosis not present

## 2013-12-13 DIAGNOSIS — N3941 Urge incontinence: Secondary | ICD-10-CM | POA: Diagnosis not present

## 2013-12-13 DIAGNOSIS — N318 Other neuromuscular dysfunction of bladder: Secondary | ICD-10-CM | POA: Diagnosis not present

## 2013-12-28 DIAGNOSIS — R21 Rash and other nonspecific skin eruption: Secondary | ICD-10-CM | POA: Diagnosis not present

## 2013-12-28 DIAGNOSIS — L723 Sebaceous cyst: Secondary | ICD-10-CM | POA: Diagnosis not present

## 2013-12-28 DIAGNOSIS — G9009 Other idiopathic peripheral autonomic neuropathy: Secondary | ICD-10-CM | POA: Diagnosis not present

## 2013-12-28 DIAGNOSIS — L299 Pruritus, unspecified: Secondary | ICD-10-CM | POA: Diagnosis not present

## 2013-12-28 DIAGNOSIS — B079 Viral wart, unspecified: Secondary | ICD-10-CM | POA: Diagnosis not present

## 2014-01-02 ENCOUNTER — Ambulatory Visit (INDEPENDENT_AMBULATORY_CARE_PROVIDER_SITE_OTHER): Payer: Medicare Other | Admitting: Podiatry

## 2014-01-02 ENCOUNTER — Encounter: Payer: Self-pay | Admitting: Podiatry

## 2014-01-02 VITALS — Resp 16 | Ht 59.0 in | Wt 118.0 lb

## 2014-01-02 DIAGNOSIS — L600 Ingrowing nail: Secondary | ICD-10-CM | POA: Diagnosis not present

## 2014-01-02 NOTE — Progress Notes (Signed)
Subjective:     Patient ID: Tammy Mcdaniel, female   DOB: 1922-05-19, 80 y.o.   MRN: [id number]  HPI patient states I have a painful ingrown toenail on my right big toe that I try to trim and soak without relief and I need to have it removed like the other foot   Review of Systems  All other systems reviewed and are negative.      Objective:   Physical Exam  Nursing note and vitals reviewed. Constitutional: She is oriented to person, place, and time.  Cardiovascular: Intact distal pulses.   Musculoskeletal: Normal range of motion.  Neurological: She is oriented to person, place, and time.  Skin: Skin is warm.   neurovascular status intact with muscle strength adequate subtalar midtarsal joint and normal range of motion. Digits are well perfused and the hallux right lateral border is incurvated and sore when pressed with a damaged nail bed    Assessment:     Ingrown toenail right hallux lateral border with pain    Plan:     Advised on correction and explained the risk of procedure. Patient wants surgery and today I infiltrated 60 mg Xylocaine Marcaine mixture remove the lateral border of the exposed matrix and applied chemical consisting of phenol 3 applications 30 seconds followed by alcohol lavaged and sterile dressing. Gave instructions on soaks and reappoint

## 2014-01-02 NOTE — Patient Instructions (Signed)

## 2014-01-02 NOTE — Progress Notes (Signed)
   Subjective:    Patient ID: Tammy Mcdaniel, female    DOB: 09/18/1921, 80 y.o.   MRN: [id number]  HPI Comments: N pain L great toenail rt D 3 m O slowly C worse A bumping toe, narrow shoes T pt cuts toenails      Review of Systems  Constitutional: Positive for fatigue.  HENT: Positive for hearing loss.   Eyes: Positive for visual disturbance.  Respiratory: Positive for cough.   Gastrointestinal: Positive for constipation.  Genitourinary: Positive for urgency and frequency.  Musculoskeletal: Positive for back pain.  Allergic/Immunologic: Positive for food allergies.  Neurological: Positive for tremors.  Hematological: Bruises/bleeds easily.  All other systems reviewed and are negative.      Objective:   Physical Exam        Assessment & Plan:

## 2014-01-24 DIAGNOSIS — M999 Biomechanical lesion, unspecified: Secondary | ICD-10-CM | POA: Diagnosis not present

## 2014-01-24 DIAGNOSIS — M546 Pain in thoracic spine: Secondary | ICD-10-CM | POA: Diagnosis not present

## 2014-01-24 DIAGNOSIS — M729 Fibroblastic disorder, unspecified: Secondary | ICD-10-CM | POA: Diagnosis not present

## 2014-02-08 DIAGNOSIS — I10 Essential (primary) hypertension: Secondary | ICD-10-CM | POA: Diagnosis not present

## 2014-02-08 DIAGNOSIS — L218 Other seborrheic dermatitis: Secondary | ICD-10-CM | POA: Diagnosis not present

## 2014-02-09 DIAGNOSIS — M546 Pain in thoracic spine: Secondary | ICD-10-CM | POA: Diagnosis not present

## 2014-02-09 DIAGNOSIS — M729 Fibroblastic disorder, unspecified: Secondary | ICD-10-CM | POA: Diagnosis not present

## 2014-02-09 DIAGNOSIS — M999 Biomechanical lesion, unspecified: Secondary | ICD-10-CM | POA: Diagnosis not present

## 2014-03-05 DIAGNOSIS — L723 Sebaceous cyst: Secondary | ICD-10-CM | POA: Diagnosis not present

## 2014-03-05 DIAGNOSIS — M729 Fibroblastic disorder, unspecified: Secondary | ICD-10-CM | POA: Diagnosis not present

## 2014-03-05 DIAGNOSIS — M999 Biomechanical lesion, unspecified: Secondary | ICD-10-CM | POA: Diagnosis not present

## 2014-03-05 DIAGNOSIS — L219 Seborrheic dermatitis, unspecified: Secondary | ICD-10-CM | POA: Diagnosis not present

## 2014-03-05 DIAGNOSIS — IMO0002 Reserved for concepts with insufficient information to code with codable children: Secondary | ICD-10-CM | POA: Diagnosis not present

## 2014-03-22 DIAGNOSIS — M999 Biomechanical lesion, unspecified: Secondary | ICD-10-CM | POA: Diagnosis not present

## 2014-03-22 DIAGNOSIS — M729 Fibroblastic disorder, unspecified: Secondary | ICD-10-CM | POA: Diagnosis not present

## 2014-03-22 DIAGNOSIS — IMO0002 Reserved for concepts with insufficient information to code with codable children: Secondary | ICD-10-CM | POA: Diagnosis not present

## 2014-04-16 DIAGNOSIS — IMO0002 Reserved for concepts with insufficient information to code with codable children: Secondary | ICD-10-CM | POA: Diagnosis not present

## 2014-04-16 DIAGNOSIS — M729 Fibroblastic disorder, unspecified: Secondary | ICD-10-CM | POA: Diagnosis not present

## 2014-04-16 DIAGNOSIS — M999 Biomechanical lesion, unspecified: Secondary | ICD-10-CM | POA: Diagnosis not present

## 2014-05-11 DIAGNOSIS — M999 Biomechanical lesion, unspecified: Secondary | ICD-10-CM | POA: Diagnosis not present

## 2014-05-11 DIAGNOSIS — M545 Low back pain, unspecified: Secondary | ICD-10-CM | POA: Diagnosis not present

## 2014-05-11 DIAGNOSIS — M729 Fibroblastic disorder, unspecified: Secondary | ICD-10-CM | POA: Diagnosis not present

## 2014-05-15 DIAGNOSIS — I10 Essential (primary) hypertension: Secondary | ICD-10-CM | POA: Diagnosis not present

## 2014-05-25 DIAGNOSIS — H903 Sensorineural hearing loss, bilateral: Secondary | ICD-10-CM | POA: Diagnosis not present

## 2014-05-30 DIAGNOSIS — C50919 Malignant neoplasm of unspecified site of unspecified female breast: Secondary | ICD-10-CM | POA: Diagnosis not present

## 2014-06-10 ENCOUNTER — Ambulatory Visit: Payer: Self-pay | Admitting: Internal Medicine

## 2014-06-10 DIAGNOSIS — S79912A Unspecified injury of left hip, initial encounter: Secondary | ICD-10-CM | POA: Diagnosis not present

## 2014-06-10 DIAGNOSIS — S300XXA Contusion of lower back and pelvis, initial encounter: Secondary | ICD-10-CM | POA: Diagnosis not present

## 2014-06-10 DIAGNOSIS — W109XXA Fall (on) (from) unspecified stairs and steps, initial encounter: Secondary | ICD-10-CM | POA: Diagnosis not present

## 2014-06-10 DIAGNOSIS — I10 Essential (primary) hypertension: Secondary | ICD-10-CM | POA: Diagnosis not present

## 2014-06-12 DIAGNOSIS — M9903 Segmental and somatic dysfunction of lumbar region: Secondary | ICD-10-CM | POA: Diagnosis not present

## 2014-06-12 DIAGNOSIS — M729 Fibroblastic disorder, unspecified: Secondary | ICD-10-CM | POA: Diagnosis not present

## 2014-06-12 DIAGNOSIS — M9905 Segmental and somatic dysfunction of pelvic region: Secondary | ICD-10-CM | POA: Diagnosis not present

## 2014-06-12 DIAGNOSIS — M5417 Radiculopathy, lumbosacral region: Secondary | ICD-10-CM | POA: Diagnosis not present

## 2014-06-26 DIAGNOSIS — I10 Essential (primary) hypertension: Secondary | ICD-10-CM | POA: Diagnosis not present

## 2014-06-26 DIAGNOSIS — S300XXD Contusion of lower back and pelvis, subsequent encounter: Secondary | ICD-10-CM | POA: Diagnosis not present

## 2014-07-02 ENCOUNTER — Ambulatory Visit: Payer: Self-pay | Admitting: Physician Assistant

## 2014-07-02 DIAGNOSIS — Z85828 Personal history of other malignant neoplasm of skin: Secondary | ICD-10-CM | POA: Diagnosis not present

## 2014-07-02 DIAGNOSIS — I10 Essential (primary) hypertension: Secondary | ICD-10-CM | POA: Diagnosis not present

## 2014-07-02 DIAGNOSIS — Z87442 Personal history of urinary calculi: Secondary | ICD-10-CM | POA: Diagnosis not present

## 2014-07-02 DIAGNOSIS — R102 Pelvic and perineal pain: Secondary | ICD-10-CM | POA: Diagnosis not present

## 2014-07-02 DIAGNOSIS — M160 Bilateral primary osteoarthritis of hip: Secondary | ICD-10-CM | POA: Diagnosis not present

## 2014-07-02 DIAGNOSIS — W109XXA Fall (on) (from) unspecified stairs and steps, initial encounter: Secondary | ICD-10-CM | POA: Diagnosis not present

## 2014-07-02 DIAGNOSIS — M549 Dorsalgia, unspecified: Secondary | ICD-10-CM | POA: Diagnosis not present

## 2014-07-04 DIAGNOSIS — L570 Actinic keratosis: Secondary | ICD-10-CM | POA: Diagnosis not present

## 2014-07-16 DIAGNOSIS — M545 Low back pain: Secondary | ICD-10-CM | POA: Diagnosis not present

## 2014-07-16 DIAGNOSIS — M7062 Trochanteric bursitis, left hip: Secondary | ICD-10-CM | POA: Diagnosis not present

## 2014-08-01 ENCOUNTER — Ambulatory Visit: Payer: Self-pay | Admitting: Family Medicine

## 2014-08-01 DIAGNOSIS — R42 Dizziness and giddiness: Secondary | ICD-10-CM | POA: Diagnosis not present

## 2014-08-01 LAB — CBC WITH DIFFERENTIAL/PLATELET
Basophil #: 0.1 10*3/uL (ref 0.0–0.1)
Basophil %: 0.8 %
EOS ABS: 0 10*3/uL (ref 0.0–0.7)
EOS PCT: 0.5 %
HCT: 45.4 % (ref 35.0–47.0)
HGB: 14.5 g/dL (ref 12.0–16.0)
LYMPHS PCT: 25.7 %
Lymphocyte #: 2.3 10*3/uL (ref 1.0–3.6)
MCH: 29.8 pg (ref 26.0–34.0)
MCHC: 32 g/dL (ref 32.0–36.0)
MCV: 93 fL (ref 80–100)
MONO ABS: 0.5 x10 3/mm (ref 0.2–0.9)
MONOS PCT: 6.2 %
NEUTROS PCT: 66.8 %
Neutrophil #: 5.9 10*3/uL (ref 1.4–6.5)
PLATELETS: 233 10*3/uL (ref 150–440)
RBC: 4.86 10*6/uL (ref 3.80–5.20)
RDW: 15.5 % — ABNORMAL HIGH (ref 11.5–14.5)
WBC: 8.9 10*3/uL (ref 3.6–11.0)

## 2014-08-01 LAB — BASIC METABOLIC PANEL
Anion Gap: 6 — ABNORMAL LOW (ref 7–16)
BUN: 29 mg/dL — ABNORMAL HIGH (ref 7–18)
CREATININE: 1.22 mg/dL (ref 0.60–1.30)
Calcium, Total: 10 mg/dL (ref 8.5–10.1)
Chloride: 102 mmol/L (ref 98–107)
Co2: 35 mmol/L — ABNORMAL HIGH (ref 21–32)
EGFR (African American): 53 — ABNORMAL LOW
EGFR (Non-African Amer.): 44 — ABNORMAL LOW
Glucose: 103 mg/dL — ABNORMAL HIGH (ref 65–99)
Osmolality: 291 (ref 275–301)
Potassium: 4.5 mmol/L (ref 3.5–5.1)
Sodium: 143 mmol/L (ref 136–145)

## 2014-08-01 LAB — URINALYSIS, COMPLETE
BACTERIA: NEGATIVE
Bilirubin,UR: NEGATIVE
Blood: NEGATIVE
GLUCOSE, UR: NEGATIVE
Ketone: NEGATIVE
NITRITE: NEGATIVE
PH: 7.5 (ref 5.0–8.0)
Protein: NEGATIVE
RBC, UR: NONE SEEN /HPF (ref 0–5)
SPECIFIC GRAVITY: 1.02 (ref 1.000–1.030)
Squamous Epithelial: NONE SEEN

## 2014-08-02 DIAGNOSIS — G939 Disorder of brain, unspecified: Secondary | ICD-10-CM | POA: Diagnosis not present

## 2014-08-02 DIAGNOSIS — R2689 Other abnormalities of gait and mobility: Secondary | ICD-10-CM | POA: Diagnosis not present

## 2014-08-02 DIAGNOSIS — R269 Unspecified abnormalities of gait and mobility: Secondary | ICD-10-CM | POA: Diagnosis not present

## 2014-08-02 DIAGNOSIS — I10 Essential (primary) hypertension: Secondary | ICD-10-CM | POA: Diagnosis not present

## 2014-08-02 DIAGNOSIS — I639 Cerebral infarction, unspecified: Secondary | ICD-10-CM | POA: Diagnosis not present

## 2014-08-02 DIAGNOSIS — I951 Orthostatic hypotension: Secondary | ICD-10-CM | POA: Diagnosis not present

## 2014-08-02 DIAGNOSIS — Z853 Personal history of malignant neoplasm of breast: Secondary | ICD-10-CM | POA: Diagnosis not present

## 2014-08-02 DIAGNOSIS — R42 Dizziness and giddiness: Secondary | ICD-10-CM | POA: Diagnosis not present

## 2014-08-02 DIAGNOSIS — Z9012 Acquired absence of left breast and nipple: Secondary | ICD-10-CM | POA: Diagnosis not present

## 2014-08-02 DIAGNOSIS — R32 Unspecified urinary incontinence: Secondary | ICD-10-CM | POA: Diagnosis not present

## 2014-08-02 DIAGNOSIS — I440 Atrioventricular block, first degree: Secondary | ICD-10-CM | POA: Diagnosis not present

## 2014-08-02 DIAGNOSIS — I671 Cerebral aneurysm, nonruptured: Secondary | ICD-10-CM | POA: Diagnosis not present

## 2014-08-02 DIAGNOSIS — G9389 Other specified disorders of brain: Secondary | ICD-10-CM | POA: Diagnosis not present

## 2014-08-03 LAB — URINE CULTURE

## 2014-08-08 DIAGNOSIS — Z23 Encounter for immunization: Secondary | ICD-10-CM | POA: Diagnosis not present

## 2014-08-08 DIAGNOSIS — H8110 Benign paroxysmal vertigo, unspecified ear: Secondary | ICD-10-CM | POA: Diagnosis not present

## 2014-08-14 ENCOUNTER — Ambulatory Visit: Payer: Self-pay | Admitting: Family Medicine

## 2014-08-14 DIAGNOSIS — H8110 Benign paroxysmal vertigo, unspecified ear: Secondary | ICD-10-CM | POA: Diagnosis not present

## 2014-08-14 DIAGNOSIS — R42 Dizziness and giddiness: Secondary | ICD-10-CM | POA: Diagnosis not present

## 2014-08-14 DIAGNOSIS — Z0000 Encounter for general adult medical examination without abnormal findings: Secondary | ICD-10-CM | POA: Diagnosis not present

## 2014-08-14 DIAGNOSIS — I6523 Occlusion and stenosis of bilateral carotid arteries: Secondary | ICD-10-CM | POA: Diagnosis not present

## 2014-09-13 DIAGNOSIS — M9905 Segmental and somatic dysfunction of pelvic region: Secondary | ICD-10-CM | POA: Diagnosis not present

## 2014-09-13 DIAGNOSIS — M729 Fibroblastic disorder, unspecified: Secondary | ICD-10-CM | POA: Diagnosis not present

## 2014-09-13 DIAGNOSIS — M9903 Segmental and somatic dysfunction of lumbar region: Secondary | ICD-10-CM | POA: Diagnosis not present

## 2014-09-13 DIAGNOSIS — M545 Low back pain: Secondary | ICD-10-CM | POA: Diagnosis not present

## 2014-09-25 DIAGNOSIS — M9903 Segmental and somatic dysfunction of lumbar region: Secondary | ICD-10-CM | POA: Diagnosis not present

## 2014-09-25 DIAGNOSIS — M545 Low back pain: Secondary | ICD-10-CM | POA: Diagnosis not present

## 2014-09-25 DIAGNOSIS — M9905 Segmental and somatic dysfunction of pelvic region: Secondary | ICD-10-CM | POA: Diagnosis not present

## 2014-09-25 DIAGNOSIS — M729 Fibroblastic disorder, unspecified: Secondary | ICD-10-CM | POA: Diagnosis not present

## 2014-11-26 DIAGNOSIS — N3941 Urge incontinence: Secondary | ICD-10-CM | POA: Diagnosis not present

## 2014-11-28 DIAGNOSIS — C50912 Malignant neoplasm of unspecified site of left female breast: Secondary | ICD-10-CM | POA: Diagnosis not present

## 2014-11-28 DIAGNOSIS — M799 Soft tissue disorder, unspecified: Secondary | ICD-10-CM | POA: Diagnosis not present

## 2014-11-29 DIAGNOSIS — B880 Other acariasis: Secondary | ICD-10-CM | POA: Diagnosis not present

## 2014-11-29 DIAGNOSIS — L218 Other seborrheic dermatitis: Secondary | ICD-10-CM | POA: Diagnosis not present

## 2014-11-30 DIAGNOSIS — M799 Soft tissue disorder, unspecified: Secondary | ICD-10-CM | POA: Diagnosis not present

## 2014-11-30 DIAGNOSIS — Z049 Encounter for examination and observation for unspecified reason: Secondary | ICD-10-CM | POA: Diagnosis not present

## 2014-12-05 DIAGNOSIS — H1013 Acute atopic conjunctivitis, bilateral: Secondary | ICD-10-CM | POA: Diagnosis not present

## 2014-12-14 DIAGNOSIS — H01006 Unspecified blepharitis left eye, unspecified eyelid: Secondary | ICD-10-CM | POA: Diagnosis not present

## 2015-01-18 DIAGNOSIS — H35363 Drusen (degenerative) of macula, bilateral: Secondary | ICD-10-CM | POA: Diagnosis not present

## 2015-01-24 DIAGNOSIS — R55 Syncope and collapse: Secondary | ICD-10-CM | POA: Diagnosis not present

## 2015-01-24 DIAGNOSIS — M5137 Other intervertebral disc degeneration, lumbosacral region: Secondary | ICD-10-CM | POA: Diagnosis not present

## 2015-01-24 DIAGNOSIS — R296 Repeated falls: Secondary | ICD-10-CM | POA: Diagnosis not present

## 2015-01-24 DIAGNOSIS — M5002 Cervical disc disorder with myelopathy, mid-cervical region: Secondary | ICD-10-CM | POA: Diagnosis not present

## 2015-01-24 DIAGNOSIS — M5127 Other intervertebral disc displacement, lumbosacral region: Secondary | ICD-10-CM | POA: Diagnosis not present

## 2015-01-24 DIAGNOSIS — M5001 Cervical disc disorder with myelopathy,  high cervical region: Secondary | ICD-10-CM | POA: Diagnosis not present

## 2015-01-24 DIAGNOSIS — M47814 Spondylosis without myelopathy or radiculopathy, thoracic region: Secondary | ICD-10-CM | POA: Diagnosis not present

## 2015-01-24 DIAGNOSIS — M545 Low back pain: Secondary | ICD-10-CM | POA: Diagnosis not present

## 2015-01-24 DIAGNOSIS — M5134 Other intervertebral disc degeneration, thoracic region: Secondary | ICD-10-CM | POA: Diagnosis not present

## 2015-01-24 DIAGNOSIS — M47817 Spondylosis without myelopathy or radiculopathy, lumbosacral region: Secondary | ICD-10-CM | POA: Diagnosis not present

## 2015-01-24 DIAGNOSIS — C50911 Malignant neoplasm of unspecified site of right female breast: Secondary | ICD-10-CM | POA: Diagnosis not present

## 2015-01-24 DIAGNOSIS — R2689 Other abnormalities of gait and mobility: Secondary | ICD-10-CM | POA: Diagnosis not present

## 2015-01-29 DIAGNOSIS — C50912 Malignant neoplasm of unspecified site of left female breast: Secondary | ICD-10-CM | POA: Diagnosis not present

## 2015-01-30 DIAGNOSIS — L718 Other rosacea: Secondary | ICD-10-CM | POA: Diagnosis not present

## 2015-01-30 DIAGNOSIS — M546 Pain in thoracic spine: Secondary | ICD-10-CM | POA: Diagnosis not present

## 2015-01-30 DIAGNOSIS — L570 Actinic keratosis: Secondary | ICD-10-CM | POA: Diagnosis not present

## 2015-01-30 DIAGNOSIS — M9903 Segmental and somatic dysfunction of lumbar region: Secondary | ICD-10-CM | POA: Diagnosis not present

## 2015-01-30 DIAGNOSIS — M729 Fibroblastic disorder, unspecified: Secondary | ICD-10-CM | POA: Diagnosis not present

## 2015-01-30 DIAGNOSIS — M9902 Segmental and somatic dysfunction of thoracic region: Secondary | ICD-10-CM | POA: Diagnosis not present

## 2015-02-06 DIAGNOSIS — L570 Actinic keratosis: Secondary | ICD-10-CM | POA: Diagnosis not present

## 2015-02-13 DIAGNOSIS — L570 Actinic keratosis: Secondary | ICD-10-CM | POA: Diagnosis not present

## 2015-02-13 DIAGNOSIS — L718 Other rosacea: Secondary | ICD-10-CM | POA: Diagnosis not present

## 2015-02-20 DIAGNOSIS — M25561 Pain in right knee: Secondary | ICD-10-CM | POA: Diagnosis not present

## 2015-02-20 DIAGNOSIS — M1711 Unilateral primary osteoarthritis, right knee: Secondary | ICD-10-CM | POA: Diagnosis not present

## 2015-02-26 DIAGNOSIS — M729 Fibroblastic disorder, unspecified: Secondary | ICD-10-CM | POA: Diagnosis not present

## 2015-02-26 DIAGNOSIS — M546 Pain in thoracic spine: Secondary | ICD-10-CM | POA: Diagnosis not present

## 2015-02-26 DIAGNOSIS — M9903 Segmental and somatic dysfunction of lumbar region: Secondary | ICD-10-CM | POA: Diagnosis not present

## 2015-02-26 DIAGNOSIS — M9902 Segmental and somatic dysfunction of thoracic region: Secondary | ICD-10-CM | POA: Diagnosis not present

## 2015-03-27 DIAGNOSIS — M9904 Segmental and somatic dysfunction of sacral region: Secondary | ICD-10-CM | POA: Diagnosis not present

## 2015-03-27 DIAGNOSIS — L570 Actinic keratosis: Secondary | ICD-10-CM | POA: Diagnosis not present

## 2015-03-27 DIAGNOSIS — M729 Fibroblastic disorder, unspecified: Secondary | ICD-10-CM | POA: Diagnosis not present

## 2015-03-27 DIAGNOSIS — M9903 Segmental and somatic dysfunction of lumbar region: Secondary | ICD-10-CM | POA: Diagnosis not present

## 2015-03-27 DIAGNOSIS — M5417 Radiculopathy, lumbosacral region: Secondary | ICD-10-CM | POA: Diagnosis not present

## 2015-04-04 DIAGNOSIS — H903 Sensorineural hearing loss, bilateral: Secondary | ICD-10-CM | POA: Diagnosis not present

## 2015-04-04 DIAGNOSIS — H6120 Impacted cerumen, unspecified ear: Secondary | ICD-10-CM | POA: Diagnosis not present

## 2015-04-12 DIAGNOSIS — M9904 Segmental and somatic dysfunction of sacral region: Secondary | ICD-10-CM | POA: Diagnosis not present

## 2015-04-12 DIAGNOSIS — M9903 Segmental and somatic dysfunction of lumbar region: Secondary | ICD-10-CM | POA: Diagnosis not present

## 2015-04-12 DIAGNOSIS — M729 Fibroblastic disorder, unspecified: Secondary | ICD-10-CM | POA: Diagnosis not present

## 2015-04-12 DIAGNOSIS — M5417 Radiculopathy, lumbosacral region: Secondary | ICD-10-CM | POA: Diagnosis not present

## 2015-04-19 DIAGNOSIS — M1711 Unilateral primary osteoarthritis, right knee: Secondary | ICD-10-CM | POA: Diagnosis not present

## 2015-04-27 ENCOUNTER — Ambulatory Visit
Admission: EM | Admit: 2015-04-27 | Discharge: 2015-04-27 | Disposition: A | Discharge status: Home / Self Care | Payer: Medicare Other | Attending: Family Medicine | Admitting: Family Medicine

## 2015-04-27 ENCOUNTER — Encounter: Payer: Self-pay | Admitting: Emergency Medicine

## 2015-04-27 ENCOUNTER — Ambulatory Visit: Payer: Medicare Other

## 2015-04-27 DIAGNOSIS — S61411A Laceration without foreign body of right hand, initial encounter: Secondary | ICD-10-CM | POA: Insufficient documentation

## 2015-04-27 DIAGNOSIS — S61511A Laceration without foreign body of right wrist, initial encounter: Secondary | ICD-10-CM | POA: Insufficient documentation

## 2015-04-27 DIAGNOSIS — S61216A Laceration without foreign body of right little finger without damage to nail, initial encounter: Secondary | ICD-10-CM | POA: Diagnosis not present

## 2015-04-27 DIAGNOSIS — S6991XA Unspecified injury of right wrist, hand and finger(s), initial encounter: Secondary | ICD-10-CM | POA: Diagnosis not present

## 2015-04-27 DIAGNOSIS — W19XXXA Unspecified fall, initial encounter: Secondary | ICD-10-CM | POA: Insufficient documentation

## 2015-04-27 HISTORY — DX: Ulcerative colitis, unspecified, without complications: K51.90

## 2015-04-27 NOTE — ED Provider Notes (Signed)
SUBJECTIVE:  81 y.o. female sustained laceration of the right hand about 8 hours ago. Patient fell outside on walkway earlier today. TD Immunization patient states was within 5 years. Denies any other injury besides right hand. There is some bruising of the area.    OBJECTIVE:  Patient appears well, Vitals as per Epic.  SKIN-there is a half dime-sized laceration on the palmar surface of the hand in hypothenar region MSK-mild ecchymosis noted of the right wrist and palmar aspect of the right hand, no snuffbox tenderness, full range of motion of the right wrist and digits, NV intact  ASSESSMENT:  Laceration R Hand, R Wrist/Hand Contusion  PLAN:  Anesthesia with 1% Lidocaine w/o Epinephrine. Wound cleansed and sutured with 5-0 nylon, 3 simple sutures were placed to close wound. Dressing applied.  Wound care instructions provided.  Observe for any signs of infection or other problems.  Return for suture removal in 7-10 days. X-rays of the right wrist and hand were negative for acute fracture. Seek medical attention if patient has any further problems with wrist or hand.  Paulina Fusi, MD 04/27/15 [phone]

## 2015-04-27 NOTE — ED Notes (Signed)
Was in [place] with daughter today and stepped off sidewalk and fell.

## 2015-05-06 ENCOUNTER — Ambulatory Visit
Admission: EM | Admit: 2015-05-06 | Discharge: 2015-05-06 | Disposition: A | Discharge status: Home / Self Care | Payer: Medicare Other

## 2015-05-06 NOTE — ED Notes (Signed)
3 sutures placed 04/27/15 right hand after falling on a sidewalk. Here for suture removal. Wound healed

## 2015-05-08 DIAGNOSIS — M729 Fibroblastic disorder, unspecified: Secondary | ICD-10-CM | POA: Diagnosis not present

## 2015-05-08 DIAGNOSIS — I10 Essential (primary) hypertension: Secondary | ICD-10-CM | POA: Diagnosis not present

## 2015-05-08 DIAGNOSIS — R42 Dizziness and giddiness: Secondary | ICD-10-CM | POA: Diagnosis not present

## 2015-05-08 DIAGNOSIS — Z79899 Other long term (current) drug therapy: Secondary | ICD-10-CM | POA: Diagnosis not present

## 2015-05-08 DIAGNOSIS — M9903 Segmental and somatic dysfunction of lumbar region: Secondary | ICD-10-CM | POA: Diagnosis not present

## 2015-05-08 DIAGNOSIS — M8580 Other specified disorders of bone density and structure, unspecified site: Secondary | ICD-10-CM | POA: Diagnosis not present

## 2015-05-08 DIAGNOSIS — D329 Benign neoplasm of meninges, unspecified: Secondary | ICD-10-CM | POA: Diagnosis not present

## 2015-05-08 DIAGNOSIS — M549 Dorsalgia, unspecified: Secondary | ICD-10-CM | POA: Diagnosis not present

## 2015-05-08 DIAGNOSIS — C50812 Malignant neoplasm of overlapping sites of left female breast: Secondary | ICD-10-CM | POA: Diagnosis not present

## 2015-05-08 DIAGNOSIS — G8929 Other chronic pain: Secondary | ICD-10-CM | POA: Diagnosis not present

## 2015-05-08 DIAGNOSIS — Z86018 Personal history of other benign neoplasm: Secondary | ICD-10-CM | POA: Diagnosis not present

## 2015-05-08 DIAGNOSIS — M5417 Radiculopathy, lumbosacral region: Secondary | ICD-10-CM | POA: Diagnosis not present

## 2015-05-08 DIAGNOSIS — C50912 Malignant neoplasm of unspecified site of left female breast: Secondary | ICD-10-CM | POA: Diagnosis not present

## 2015-05-08 DIAGNOSIS — Z780 Asymptomatic menopausal state: Secondary | ICD-10-CM | POA: Diagnosis not present

## 2015-05-08 DIAGNOSIS — Z08 Encounter for follow-up examination after completed treatment for malignant neoplasm: Secondary | ICD-10-CM | POA: Diagnosis not present

## 2015-05-08 DIAGNOSIS — D320 Benign neoplasm of cerebral meninges: Secondary | ICD-10-CM | POA: Diagnosis not present

## 2015-05-08 DIAGNOSIS — Z8042 Family history of malignant neoplasm of prostate: Secondary | ICD-10-CM | POA: Diagnosis not present

## 2015-05-08 DIAGNOSIS — M9904 Segmental and somatic dysfunction of sacral region: Secondary | ICD-10-CM | POA: Diagnosis not present

## 2015-05-08 DIAGNOSIS — M25552 Pain in left hip: Secondary | ICD-10-CM | POA: Diagnosis not present

## 2015-05-08 DIAGNOSIS — Z853 Personal history of malignant neoplasm of breast: Secondary | ICD-10-CM | POA: Diagnosis not present

## 2015-05-08 DIAGNOSIS — Z9013 Acquired absence of bilateral breasts and nipples: Secondary | ICD-10-CM | POA: Diagnosis not present

## 2015-05-08 DIAGNOSIS — Z90710 Acquired absence of both cervix and uterus: Secondary | ICD-10-CM | POA: Diagnosis not present

## 2015-05-08 DIAGNOSIS — M25561 Pain in right knee: Secondary | ICD-10-CM | POA: Diagnosis not present

## 2015-05-15 ENCOUNTER — Encounter: Payer: Self-pay | Admitting: Family Medicine

## 2015-05-15 ENCOUNTER — Ambulatory Visit (INDEPENDENT_AMBULATORY_CARE_PROVIDER_SITE_OTHER): Payer: Medicare Other | Admitting: Family Medicine

## 2015-05-15 VITALS — BP 120/82 | HR 64 | Ht <= 58 in | Wt 120.0 lb

## 2015-05-15 DIAGNOSIS — L853 Xerosis cutis: Secondary | ICD-10-CM | POA: Diagnosis not present

## 2015-05-15 DIAGNOSIS — L720 Epidermal cyst: Secondary | ICD-10-CM | POA: Diagnosis not present

## 2015-05-15 DIAGNOSIS — L570 Actinic keratosis: Secondary | ICD-10-CM | POA: Diagnosis not present

## 2015-05-15 DIAGNOSIS — L718 Other rosacea: Secondary | ICD-10-CM | POA: Diagnosis not present

## 2015-05-15 DIAGNOSIS — B078 Other viral warts: Secondary | ICD-10-CM | POA: Diagnosis not present

## 2015-05-15 DIAGNOSIS — R42 Dizziness and giddiness: Secondary | ICD-10-CM

## 2015-05-15 DIAGNOSIS — R296 Repeated falls: Secondary | ICD-10-CM | POA: Diagnosis not present

## 2015-05-15 NOTE — Progress Notes (Signed)
Name: Tammy Mcdaniel   MRN: [id number]    DOB: 1922-04-23   Date:05/15/2015       Progress Note  Subjective  Chief Complaint  Chief Complaint  Patient presents with  . Fall    pt has had multiple falls in the past year- feels like "my feet get crossed up" - has not hit head at anytime during fall     Fall The accident occurred more than 1 week ago. The fall occurred while walking. She landed on concrete. There was no blood loss. The symptoms are aggravated by ambulation. Pertinent negatives include no abdominal pain, bowel incontinence, fever, headaches, hearing loss, hematuria, loss of consciousness, nausea, numbness, tingling, visual change or vomiting. She has tried nothing (mri/ small hemagioma) for the symptoms. The treatment provided no relief.  Dizziness This is a recurrent problem. The current episode started more than 1 month ago. Associated symptoms include chest pain, fatigue and vertigo. Pertinent negatives include no abdominal pain, anorexia, arthralgias, change in bowel habit, chills, congestion, coughing, diaphoresis, fever, headaches, myalgias, nausea, neck pain, numbness, rash, sore throat, visual change or vomiting. Associated symptoms comments: Sharp/s/p radiation. Exacerbated by: able to do tai chi. She has tried nothing for the symptoms. The treatment provided no relief.    No problem-specific assessment & plan notes found for this encounter.   Past Medical History  Diagnosis Date  . Cancer   . Colitis, ulcerative     Past Surgical History  Procedure Laterality Date  . Breast surgery Left     No family history on file.  Social History   Social History  . Marital Status: Single    Spouse Name: N/A  . Number of Children: N/A  . Years of Education: N/A   Occupational History  . Not on file.   Social History Main Topics  . Smoking status: Never Smoker   . Smokeless tobacco: Not on file  . Alcohol Use: Yes  . Drug Use: Not on file  . Sexual Activity: Not  on file   Other Topics Concern  . Not on file   Social History Narrative    Allergies  Allergen Reactions  . Ciprofloxacin Diarrhea and Other (See Comments)    Internal bleeding  . Penicillins Rash  . Pseudoephedrine Rash  . Sulfa Antibiotics Rash     Review of Systems  Constitutional: Positive for malaise/fatigue and fatigue. Negative for fever, chills, weight loss and diaphoresis.  HENT: Negative for congestion, ear discharge, ear pain and sore throat.   Eyes: Negative for blurred vision.  Respiratory: Negative for cough, sputum production, shortness of breath and wheezing.   Cardiovascular: Positive for chest pain. Negative for palpitations, orthopnea and leg swelling.  Gastrointestinal: Negative for heartburn, nausea, vomiting, abdominal pain, diarrhea, constipation, blood in stool, melena, anorexia, change in bowel habit and bowel incontinence.  Genitourinary: Negative for dysuria, urgency, frequency and hematuria.  Musculoskeletal: Negative for myalgias, back pain, joint pain, arthralgias and neck pain.  Skin: Negative for rash.  Neurological: Positive for dizziness and vertigo. Negative for tingling, sensory change, focal weakness, loss of consciousness, numbness and headaches.  Endo/Heme/Allergies: Negative for environmental allergies and polydipsia. Does not bruise/bleed easily.  Psychiatric/Behavioral: Negative for depression and suicidal ideas. The patient is not nervous/anxious and does not have insomnia.      Objective  Filed Vitals:   05/15/15 0956  BP: 120/82  Pulse: 64  Height: '4\' 10"'  (1.473 m)  Weight: 120 lb (54.432 kg)    Physical Exam  Constitutional: She is well-developed, well-nourished, and in no distress. No distress.  HENT:  Head: Normocephalic and atraumatic.  Right Ear: External ear normal.  Left Ear: External ear normal.  Nose: Nose normal.  Mouth/Throat: Oropharynx is clear and moist.  Eyes: Conjunctivae and EOM are normal. Pupils are  equal, round, and reactive to light. Right eye exhibits no discharge. Left eye exhibits no discharge.  Neck: Normal range of motion. Neck supple. No JVD present. No thyromegaly present.  Cardiovascular: Normal rate, regular rhythm, normal heart sounds and intact distal pulses.  Exam reveals no gallop and no friction rub.   No murmur heard. Pulmonary/Chest: Effort normal and breath sounds normal. She exhibits tenderness.    Abdominal: Soft. Bowel sounds are normal. She exhibits no mass. There is no tenderness. There is no guarding.  Musculoskeletal: Normal range of motion. She exhibits no edema.  Lymphadenopathy:    She has no cervical adenopathy.  Neurological: She is alert. She has normal motor skills, normal sensation, normal strength, normal reflexes and intact cranial nerves. She displays no weakness. No cranial nerve deficit. She exhibits normal muscle tone. She has a normal Cerebellar Exam and a normal Romberg Test. Gait normal.  Skin: Skin is warm and dry. She is not diaphoretic.  Psychiatric: Mood and affect normal.      Assessment & Plan  Problem List Items Addressed This Visit    None    Visit Diagnoses    Dizziness, nonspecific    -  Primary    Relevant Orders    EKG 12-Lead    Ambulatory referral to Cardiology    Recurrent falls while walking        Relevant Orders    EKG 12-Lead    Ambulatory referral to Cardiology         Dr. Otilio Miu [facility]  05/15/2015

## 2015-05-15 NOTE — Patient Instructions (Signed)
Holter Monitoring A Holter monitor is a small device with electrodes (small sticky patches) that attach to your chest. It records the electrical activity of your heart and is worn continuously for 24-48 hours.  A HOLTER MONITOR IS USED TO  Detect heart problems such as:  Heart arrhythmia. Is an abnormal or irregular heartbeat. With some heart arrhythmias, you may not feel or know that you have an irregular heart rhythm.  Palpitations, such as feeling your heart racing or fluttering. It is possible to have heart palpitations and not have a heart arrhythmia.  A heart rhythm that is too slow or too fast.  If you have problems fainting, near fainting or feeling light-headed, a Holter monitor may be worn to see if your heart is the cause. HOLTER MONITOR PREPARATION   Electrodes will be attached to the skin on your chest.  If you have hair on your chest, small areas may have to be shaved. This is done to help the patches stick better and make the recording more accurate.  The electrodes are attached by wires to the Holter monitor. The Holter monitor clips to your clothing. You will wear the monitor at all times, even while exercising and sleeping. HOME CARE INSTRUCTIONS   Wear your monitor at all times.  The wires and the monitor must stay dry. Do not get the monitor wet.  Do not bathe, swim or use a hot tub with it on.  You may do a "sponge" bath while you have the monitor on.  Keep your skin clean, do not put body lotion or moisturizer on your chest.  It's possible that your skin under the electrodes could become irritated. To keep this from happening, you may put the electrodes in slightly different places on your chest.  Your caregiver will also ask you to keep a diary of your activities, such as walking or doing chores. Be sure to note what you are doing if you experience heart symptoms such as palpitations. This will help your caregiver determine what might be contributing to your  symptoms. The information stored in your monitor will be reviewed by your caregiver alongside your diary entries.  Make sure the monitor is safely clipped to your clothing or in a location close to your body that your caregiver recommends.  The monitor and electrodes are removed when the test is over. Return the monitor as directed.  Be sure to follow up with your caregiver and discuss your Holter monitor results. SEEK IMMEDIATE MEDICAL CARE IF:  You faint or feel lightheaded.  You have trouble breathing.  You get pain in your chest, upper arm or jaw.  You feel sick to your stomach and your skin is pale, cool, or damp.  You think something is wrong with the way your heart is beating. MAKE SURE YOU:   Understand these instructions.  Will watch your condition.  Will get help right away if you are not doing well or get worse. Document Released: 05/22/2004 Document Revised: 11/16/2011 Document Reviewed: 10/04/2008 ExitCare Patient Information 2015 [employer]. This information is not intended to replace advice given to you by your health care provider. Make sure you discuss any questions you have with your health care provider.  

## 2015-05-22 DIAGNOSIS — R42 Dizziness and giddiness: Secondary | ICD-10-CM | POA: Diagnosis not present

## 2015-05-22 DIAGNOSIS — R55 Syncope and collapse: Secondary | ICD-10-CM | POA: Diagnosis not present

## 2015-05-30 DIAGNOSIS — R55 Syncope and collapse: Secondary | ICD-10-CM | POA: Diagnosis not present

## 2015-05-30 DIAGNOSIS — R42 Dizziness and giddiness: Secondary | ICD-10-CM | POA: Diagnosis not present

## 2015-06-04 DIAGNOSIS — R42 Dizziness and giddiness: Secondary | ICD-10-CM | POA: Diagnosis not present

## 2015-06-04 DIAGNOSIS — R55 Syncope and collapse: Secondary | ICD-10-CM | POA: Diagnosis not present

## 2015-06-05 ENCOUNTER — Ambulatory Visit: Payer: Self-pay | Admitting: Sports Medicine

## 2015-06-11 ENCOUNTER — Ambulatory Visit: Payer: Self-pay | Admitting: Sports Medicine

## 2015-06-17 DIAGNOSIS — M545 Low back pain: Secondary | ICD-10-CM | POA: Diagnosis not present

## 2015-06-17 DIAGNOSIS — M9905 Segmental and somatic dysfunction of pelvic region: Secondary | ICD-10-CM | POA: Diagnosis not present

## 2015-06-17 DIAGNOSIS — M729 Fibroblastic disorder, unspecified: Secondary | ICD-10-CM | POA: Diagnosis not present

## 2015-06-17 DIAGNOSIS — M9904 Segmental and somatic dysfunction of sacral region: Secondary | ICD-10-CM | POA: Diagnosis not present

## 2015-07-01 ENCOUNTER — Encounter: Payer: Self-pay | Admitting: Family Medicine

## 2015-07-01 ENCOUNTER — Ambulatory Visit (INDEPENDENT_AMBULATORY_CARE_PROVIDER_SITE_OTHER): Payer: Medicare Other | Admitting: Family Medicine

## 2015-07-01 VITALS — BP 120/64 | HR 64 | Ht <= 58 in | Wt 118.0 lb

## 2015-07-01 DIAGNOSIS — I868 Varicose veins of other specified sites: Secondary | ICD-10-CM | POA: Diagnosis not present

## 2015-07-01 DIAGNOSIS — I8390 Asymptomatic varicose veins of unspecified lower extremity: Secondary | ICD-10-CM

## 2015-07-01 DIAGNOSIS — L03032 Cellulitis of left toe: Secondary | ICD-10-CM

## 2015-07-01 MED ORDER — DOXYCYCLINE HYCLATE 100 MG PO TABS: 100.0000 mg | ORAL_TABLET | Freq: Two times a day (BID) | ORAL | Status: DC

## 2015-07-01 MED ORDER — MUPIROCIN 2 % EX OINT: 1.0000 "application " | TOPICAL_OINTMENT | Freq: Two times a day (BID) | CUTANEOUS | Status: DC

## 2015-07-01 NOTE — Progress Notes (Signed)
Name: Tammy Mcdaniel   MRN: [id number]    DOB: 06-06-1922   Date:07/01/2015       Progress Note  Subjective  Chief Complaint  Chief Complaint  Patient presents with  . Varicose Veins  . Toe Pain    "looks purple" x 3 days    HPI Comments: Patient notes worsening of varicose veins.  Toe Pain  The incident occurred 3 to 5 days ago. There was no injury mechanism (polish removed 2-3 days ago/ redness prior). The pain is present in the left toes (4th toe). The quality of the pain is described as aching. The pain is at a severity of 3/10. The pain is mild. The pain has been fluctuating since onset. Pertinent negatives include no inability to bear weight, loss of motion, loss of sensation, muscle weakness, numbness or tingling. She reports no foreign bodies present. The symptoms are aggravated by weight bearing. She has tried acetaminophen and NSAIDs for the symptoms.    No problem-specific assessment & plan notes found for this encounter.   Past Medical History  Diagnosis Date  . Cancer ([place])   . Colitis, ulcerative ([place])     Past Surgical History  Procedure Laterality Date  . Breast surgery Left     History reviewed. No pertinent family history.  Social History   Social History  . Marital Status: Single    Spouse Name: N/A  . Number of Children: N/A  . Years of Education: N/A   Occupational History  . Not on file.   Social History Main Topics  . Smoking status: Never Smoker   . Smokeless tobacco: Not on file  . Alcohol Use: Yes  . Drug Use: Not on file  . Sexual Activity: Not Currently   Other Topics Concern  . Not on file   Social History Narrative    Allergies  Allergen Reactions  . Ciprofloxacin Diarrhea and Other (See Comments)    Internal bleeding  . Gluten Meal Other (See Comments)    Other Reaction: GI Upset  . Naproxen Other (See Comments)    RECTAL BLEEDING  . Other Hives  . Penicillins Rash  . Pseudoephedrine Rash  . Rogaine  [Minoxidil] Rash   . Sulfa Antibiotics Rash     Review of Systems  Constitutional: Positive for malaise/fatigue. Negative for fever, chills and weight loss.  HENT: Negative for ear discharge, ear pain and sore throat.   Eyes: Negative for blurred vision.  Respiratory: Negative for cough, sputum production, shortness of breath and wheezing.   Cardiovascular: Negative for chest pain, palpitations and leg swelling.  Gastrointestinal: Negative for heartburn, nausea, abdominal pain, diarrhea, constipation, blood in stool and melena.  Genitourinary: Negative for dysuria, urgency, frequency and hematuria.  Musculoskeletal: Negative for myalgias, back pain, joint pain and neck pain.       Toe pain  Skin: Negative for rash.  Neurological: Negative for dizziness, tingling, sensory change, focal weakness, numbness and headaches.  Endo/Heme/Allergies: Negative for environmental allergies and polydipsia. Does not bruise/bleed easily.  Psychiatric/Behavioral: Negative for depression and suicidal ideas. The patient is not nervous/anxious and does not have insomnia.      Objective  Filed Vitals:   07/01/15 0919  BP: 120/64  Pulse: 64  Height: 4\' 10"  (1.473 m)  Weight: 118 lb (53.524 kg)    Physical Exam  Constitutional: She is well-developed, well-nourished, and in no distress. No distress.  HENT:  Head: Normocephalic and atraumatic.  Right Ear: External ear normal.  Left Ear: External  ear normal.  Nose: Nose normal.  Mouth/Throat: Oropharynx is clear and moist.  Eyes: Conjunctivae and EOM are normal. Pupils are equal, round, and reactive to light. Right eye exhibits no discharge. Left eye exhibits no discharge.  Neck: Normal range of motion. Neck supple. No JVD present. No thyromegaly present.  Cardiovascular: Normal rate, regular rhythm, normal heart sounds and intact distal pulses.  Exam reveals no gallop and no friction rub.   No murmur heard. Pulmonary/Chest: Effort normal and breath sounds normal.   Abdominal: Soft. Bowel sounds are normal. She exhibits no mass. There is no tenderness. There is no guarding.  Musculoskeletal: Normal range of motion. She exhibits no edema.  Lymphadenopathy:    She has no cervical adenopathy.  Neurological: She is alert. She has normal reflexes.  Skin: Skin is warm and dry. Ecchymosis noted. She is not diaphoretic. There is erythema.  Psychiatric: Mood and affect normal.  Nursing note and vitals reviewed.     Assessment & Plan  Problem List Items Addressed This Visit    None    Visit Diagnoses    Varicose veins    -  Primary    Relevant Orders    Ambulatory referral to Vascular Surgery    Paronychia of fourth toe of left foot        Relevant Medications    doxycycline (VIBRA-TABS) 100 MG tablet    mupirocin ointment (BACTROBAN) 2 %         Dr. Freeman Borba [facility]  07/01/2015

## 2015-07-17 DIAGNOSIS — M729 Fibroblastic disorder, unspecified: Secondary | ICD-10-CM | POA: Diagnosis not present

## 2015-07-17 DIAGNOSIS — M9904 Segmental and somatic dysfunction of sacral region: Secondary | ICD-10-CM | POA: Diagnosis not present

## 2015-07-17 DIAGNOSIS — M9905 Segmental and somatic dysfunction of pelvic region: Secondary | ICD-10-CM | POA: Diagnosis not present

## 2015-07-17 DIAGNOSIS — M545 Low back pain: Secondary | ICD-10-CM | POA: Diagnosis not present

## 2015-07-24 DIAGNOSIS — Z23 Encounter for immunization: Secondary | ICD-10-CM | POA: Diagnosis not present

## 2015-07-25 DIAGNOSIS — I872 Venous insufficiency (chronic) (peripheral): Secondary | ICD-10-CM | POA: Diagnosis not present

## 2015-07-25 DIAGNOSIS — M1990 Unspecified osteoarthritis, unspecified site: Secondary | ICD-10-CM | POA: Diagnosis not present

## 2015-07-25 DIAGNOSIS — M7989 Other specified soft tissue disorders: Secondary | ICD-10-CM | POA: Diagnosis not present

## 2015-07-25 DIAGNOSIS — I8310 Varicose veins of unspecified lower extremity with inflammation: Secondary | ICD-10-CM | POA: Diagnosis not present

## 2015-07-25 DIAGNOSIS — M79609 Pain in unspecified limb: Secondary | ICD-10-CM | POA: Diagnosis not present

## 2015-08-11 ENCOUNTER — Ambulatory Visit
Admission: EM | Admit: 2015-08-11 | Discharge: 2015-08-11 | Disposition: A | Discharge status: Home / Self Care | Payer: Medicare Other | Attending: Internal Medicine | Admitting: Internal Medicine

## 2015-08-11 DIAGNOSIS — H6121 Impacted cerumen, right ear: Secondary | ICD-10-CM | POA: Diagnosis not present

## 2015-08-11 DIAGNOSIS — R030 Elevated blood-pressure reading, without diagnosis of hypertension: Secondary | ICD-10-CM

## 2015-08-11 DIAGNOSIS — R0981 Nasal congestion: Secondary | ICD-10-CM | POA: Diagnosis not present

## 2015-08-11 DIAGNOSIS — IMO0001 Reserved for inherently not codable concepts without codable children: Secondary | ICD-10-CM

## 2015-08-11 NOTE — ED Provider Notes (Signed)
CSN: [id number]     Arrival date & time 08/11/15  1301 History   First MD Initiated Contact with Patient 08/11/15 1434     Chief Complaint  Patient presents with  . Hypertension  . Dizziness   HPI  Patient is a 80 year old lady who intermittently has some difficulty with "vertigo", unsteadiness on her feet, in the last several months. She checked her blood pressure at lunchtime today, and it was quite elevated. She presented to the urgent care for further evaluation. No nausea, not vomiting. No runny/congested nose, no fever. Has not fallen since August, although she does stumble and lose her balance from time to time. Has undergone vestibular maneuvers in the past, unclear exactly which ones, which were not effective and wrist relieving dizziness. No new weakness/clumsiness of arms or legs, walked independently into the urgent care today.  Past Medical History  Diagnosis Date  . Cancer ([place])   . Colitis, ulcerative ([place])    Past Surgical History  Procedure Laterality Date  . Breast surgery Left     Social History  Substance Use Topics  . Smoking status: Never Smoker   . Smokeless tobacco: None  . Alcohol Use: Yes    Review of Systems  All other systems reviewed and are negative.   Allergies  Ciprofloxacin; Gluten meal; Naproxen; Other; Penicillins; Pseudoephedrine; Rogaine ; and Sulfa antibiotics  Home Medications   Prior to Admission medications   Medication Sig Start Date End Date Taking? Authorizing Provider  calcium carbonate (OS-CAL) 600 MG TABS tablet Take 600 mg by mouth 3 (three) times daily with meals.   Yes Historical Provider, MD  Multiple Vitamins-Minerals (PRESERVISION AREDS 2 PO) Take 1 tablet by mouth 2 (two) times daily.   Yes Historical Provider, MD                  BP 172/55 mmHg  Pulse 62  Temp(Src) 98 F (36.7 C) (Oral)  Resp 16  Ht 4\' 10"  (1.473 m)  Wt 117 lb (53.071 kg)  BMI 24.46 kg/m2  SpO2 96%   Physical Exam  Constitutional: She is  oriented to person, place, and time. No distress.  Alert, nicely groomed  HENT:  Head: Atraumatic.  L TM mod dull, no erythema; R ear canal occluded by wax. Mod nasal congestion L, nearly occluded on R. Throat slightly red.  Eyes:  Conjugate gaze, no eye redness/drainage  Neck: Neck supple.  Cardiovascular: Normal rate.   Pulmonary/Chest: No respiratory distress.  Lungs clear, symmetric breath sounds  Abdominal: She exhibits no distension.  Musculoskeletal: Normal range of motion.  No leg swelling  Neurological: She is alert and oriented to person, place, and time.  Face symmetric, speech clear/coherent Able to walk independently/quickly in to urgent care, rises from chair quickly/independently  Skin: Skin is warm and dry.  No cyanosis  Nursing note and vitals reviewed.   ED Course  Procedures (including critical care time)    MDM   1. Elevated blood pressure   2. Impacted ear wax, right   3. Sinus congestion    Patient reports the blood pressure at the urgent care is quite a bit lower than it was when she checked it at home today. Resume dash diet.   Ear wax impaction management trial as outpatient with sweet oil/cotton ball qhs followed by am irrigation with warm water/bulb syringe.  May take a few days to clear.   Might benefit from a trial of nasal steroid; consider FU with Dr Emeline General  or neurologist to discuss dizziness, vestibular maneuvers.      Sherlene Shams, MD 08/11/15 [phone]

## 2015-08-11 NOTE — Discharge Instructions (Signed)
Resume dash diet; sweet's oil to right ear daily at bedtime with cotton ball, irrigate with warm water every morning with bulb syringe; trial of nasal steroid, follow-up with Dr. Kathyrn Sheriff in ENT to discuss dizziness/vestibular maneuvers. Discharge instructions reviewed verbally with patient.

## 2015-08-11 NOTE — ED Notes (Signed)
Pt's BP was 187/113 at 12:21 today. Pt also c/o "vertigo" x 5 weeks. Pt also admits intermittent confusion, "absent minded" on Friday. Denies headache, slurred speech, chest pain, SOB. BP in office today is 172/55.

## 2015-08-12 DIAGNOSIS — M545 Low back pain: Secondary | ICD-10-CM | POA: Diagnosis not present

## 2015-08-12 DIAGNOSIS — M9905 Segmental and somatic dysfunction of pelvic region: Secondary | ICD-10-CM | POA: Diagnosis not present

## 2015-08-12 DIAGNOSIS — M9904 Segmental and somatic dysfunction of sacral region: Secondary | ICD-10-CM | POA: Diagnosis not present

## 2015-08-12 DIAGNOSIS — M729 Fibroblastic disorder, unspecified: Secondary | ICD-10-CM | POA: Diagnosis not present

## 2015-08-13 DIAGNOSIS — H6121 Impacted cerumen, right ear: Secondary | ICD-10-CM | POA: Diagnosis not present

## 2015-08-13 DIAGNOSIS — H903 Sensorineural hearing loss, bilateral: Secondary | ICD-10-CM | POA: Diagnosis not present

## 2015-08-13 DIAGNOSIS — R42 Dizziness and giddiness: Secondary | ICD-10-CM | POA: Diagnosis not present

## 2015-08-22 DIAGNOSIS — Z23 Encounter for immunization: Secondary | ICD-10-CM | POA: Diagnosis not present

## 2015-09-05 DIAGNOSIS — R55 Syncope and collapse: Secondary | ICD-10-CM | POA: Diagnosis not present

## 2015-09-05 DIAGNOSIS — R001 Bradycardia, unspecified: Secondary | ICD-10-CM | POA: Diagnosis not present

## 2015-09-05 DIAGNOSIS — R42 Dizziness and giddiness: Secondary | ICD-10-CM | POA: Diagnosis not present

## 2015-09-13 ENCOUNTER — Ambulatory Visit
Admission: EM | Admit: 2015-09-13 | Discharge: 2015-09-13 | Disposition: A | Discharge status: Home / Self Care | Payer: Medicare Other | Attending: Family Medicine | Admitting: Family Medicine

## 2015-09-13 DIAGNOSIS — I776 Arteritis, unspecified: Secondary | ICD-10-CM

## 2015-09-13 DIAGNOSIS — L259 Unspecified contact dermatitis, unspecified cause: Secondary | ICD-10-CM | POA: Diagnosis not present

## 2015-09-13 MED ORDER — PREDNISONE 10 MG (21) PO TBPK: ORAL_TABLET | ORAL | Status: DC

## 2015-09-13 NOTE — ED Notes (Signed)
Started about 1 week ago with rash on left lower leg only. Became itchy. Last night rash spread to right lower leg. Now states is very painful and itching. Had started wearing new compression hose that had latex. Stopped wearing hose 3 days ago but symptoms now worsen

## 2015-09-13 NOTE — Discharge Instructions (Signed)
Vasculitis  Vasculitis is swelling (inflammation) of the blood vessels. With vasculitis, the blood vessels can become thick, narrow, scarred, or weak, and enough blood may not be able to flow through them. This can cause damage to the muscles, kidneys, lungs, brain, and other parts of the body.   There are many types of vasculitis. Some last only a short time while others last a long time.  CAUSES   The exact cause is unknown, but vasculitis can develop when the body's immune system attacks its own blood vessels. This attack can be caused by:  · An infection.  · An immune system disease, such as lupus, rheumatoid arthritis, or scleroderma.  · An allergic reaction to a medicine.  · Cancer that affects blood cells, such as leukemia and lymphoma.  RISK FACTORS  · Being a smoker.  · Being under stress.  · Having a physical injury.  SIGNS AND SYMPTOMS   Symptoms vary depending on the type of vasculitis you have. Symptoms that are common to all types of vasculitis include:  · Fever.  · Poor appetite.  · Weight loss.  · Feeling very tired.  · Having aches and pains.  · Weakness.  · Numbness in an area of your body.  Symptoms for specific types of vasculitis include:  · Skin problems, such as sores, spots, or rashes.  · Trouble seeing.  · Trouble breathing.  · Blood in your urine.  · Headaches.  · Stomach pain.  · Stuffy or bloody nose.  DIAGNOSIS   Your health care provider will ask about your symptoms and do a physical exam. You may have tests done, such as:  · A complete blood count (CBC).  · Erythrocyte sedimentation, also called sed rate test.  · C-reactive protein (CRP).  · Antineutrophil cytoplasmic antibodies (ANCA).  · A urine test.  · A biopsy of a blood vessel.  · A nerve conduction study.  · Imaging tests, such as:    X-rays.    A CT scan.    An ultrasound.    An MRI.    Angiography.  TREATMENT   Treatment will depend on the type of vasculitis you have and how severe it is. Sometimes treatment is not needed.  Treatment often includes:  · Medicines.  · Physical therapy or occupational therapy. This helps strengthen muscles that were weakened by the disease.  You will need to see your health care provider while you are being treated. During follow-up visits, your health care provider will:  · Perform blood tests and bone density tests.  · Check your blood pressure and blood sugar.  · Check for side effects of any medicines you are taking.  Vasculitis cannot always be cured. Sometimes symptoms go away but the disease does not (the disease goes in remission). If symptoms return, increased treatment may be needed.  HOME CARE INSTRUCTIONS   · Take medicines only as directed by your health care provider.  · Keep all follow-up visits as directed by your health care provider. This is important.  · Exercise. Talk with your health care provider about what exercises are okay for you to do. Usually exercises that increase your heart rate (aerobic exercise), such as walking, are recommended. Aerobic exercise helps control your blood pressure and prevent bone loss.  · Follow a healthy diet. Include healthy sources of protein, fruits, vegetables, and whole grains in your diet.  · Learn as much as you can about vasculitis, and consider joining a support group.   Understanding your condition and talking with others who have it may help you cope. Talk with your health care provider if you feel stressed, anxious, or depressed.  SEEK MEDICAL CARE IF:   · Your symptoms return, or you have new symptoms.  · Your fever, fatigue, headache, or weight loss gets worse.  · You have signs of infection, such as fever, warmth, tenderness, redness, or swelling.  SEEK IMMEDIATE MEDICAL CARE IF:   · Your vision gets worse.  · Your pain does not go away, even after you take pain medicine.  · You have chest or stomach pain.  · You have trouble breathing.  · One side of your face or body suddenly becomes weak or numb.  · Your nose bleeds.  · There is blood in  your urine.  MAKE SURE YOU:  · Understand these instructions.  · Will watch your condition.  · Will get help right away if you are not doing well or get worse.     This information is not intended to replace advice given to you by your health care provider. Make sure you discuss any questions you have with your health care provider.     Document Released: 06/20/2009 Document Revised: 09/14/2014 Document Reviewed: 10/18/2013  Elsevier Interactive Patient Education ©2016 [employer]

## 2015-09-13 NOTE — ED Provider Notes (Signed)
CSN: [id number]     Arrival date & time 09/13/15  [id number] History   First MD Initiated Contact with Patient 09/13/15 1059    Nurses notes were reviewed. Chief Complaint  Patient presents with  . Rash    Patient reports rash both lower extremities. Rash started about a week ago and last night became worse itchy on both legs. She is start wearing compression hose as a vascular insufficiency and she also has developed recently an allergy to latex she thinks the compression hose have latex in them. She stopped wearing the toes is about 3 days ago still has symptoms and symptoms gotten worse. (Consider location/radiation/quality/duration/timing/severity/associated sxs/prior Treatment) HPI  Past Medical History  Diagnosis Date  . Cancer ([place])   . Colitis, ulcerative ([place])    Past Surgical History  Procedure Laterality Date  . Breast surgery Left   . Abdominal hysterectomy     Family History  Problem Relation Age of Onset  . Cancer Mother   . Cancer Sister   . Cancer Sister    Social History  Substance Use Topics  . Smoking status: Never Smoker   . Smokeless tobacco: None  . Alcohol Use: No   OB History    No data available     Review of Systems  Respiratory: Negative for chest tightness, shortness of breath and wheezing.   Cardiovascular: Negative for chest pain.  All other systems reviewed and are negative.   Allergies  Ciprofloxacin; Gluten meal; Naproxen; Other; Penicillins; Pseudoephedrine; Rogaine ; and Sulfa antibiotics  Home Medications   Prior to Admission medications   Medication Sig Start Date End Date Taking? Authorizing Provider  calcium carbonate (OS-CAL) 600 MG TABS tablet Take 600 mg by mouth 3 (three) times daily with meals.   Yes Historical Provider, MD  Multiple Vitamins-Minerals (PRESERVISION AREDS 2 PO) Take 1 tablet by mouth 2 (two) times daily.   Yes Historical Provider, MD  doxycycline (VIBRA-TABS) 100 MG tablet Take 1 tablet (100 mg total) by mouth 2  (two) times daily. 07/01/15   Juline Patch, MD  mupirocin ointment (BACTROBAN) 2 % Place 1 application into the nose 2 (two) times daily. 07/01/15   Juline Patch, MD  predniSONE (STERAPRED UNI-PAK 21 TAB) 10 MG (21) TBPK tablet Sig 6 tablet day 1, 5 tablets day 2, 4 tablets day 3,,3tablets day 4, 2 tablets day 5, 1 tablet day 6 take all tablets orally 09/13/15   Frederich Cha, MD   Meds Ordered and Administered this Visit  Medications - No data to display  BP 172/82 mmHg  Pulse 62  Temp(Src) 96.9 F (36.1 C) (Tympanic)  Resp 16  Ht 4\' 10"  (1.473 m)  Wt 121 lb (54.885 kg)  BMI 25.30 kg/m2  SpO2 98% No data found.   Physical Exam  Constitutional: She is oriented to person, place, and time. She appears well-nourished.  Non-toxic appearance. She does not have a sickly appearance.  Elderly white female  HENT:  Head: Normocephalic.  Eyes: Pupils are equal, round, and reactive to light.  Neck: Neck supple.  Cardiovascular: Normal rate and regular rhythm.   Pulmonary/Chest: Effort normal and breath sounds normal. She has no wheezes.  Musculoskeletal: Normal range of motion. She exhibits no edema.  Neurological: She is alert and oriented to person, place, and time.  Skin: Skin is warm and intact. Rash noted. There is erythema.     Lesions blanches and blanches consistent with a vascular dermatitis.  Psychiatric: She has a normal  mood and affect.  Vitals reviewed.   ED Course  Procedures (including critical care time)  Labs Review Labs Reviewed - No data to display  Imaging Review No results found.   Visual Acuity Review  Right Eye Distance:   Left Eye Distance:   Bilateral Distance:    Right Eye Near:   Left Eye Near:    Bilateral Near:         MDM   1. Vasculitis ([place])   2. Contact dermatitis    Patient with rash consistent with vasculitis. Explained patient that more than likely it is a reaction to the support hose. We'll place on prednisone for 6 days and  have her follow up when necessary as needed. Should be noted lungs are clear heart sounds normal.    Frederich Cha, MD 09/13/15 1118

## 2015-09-19 DIAGNOSIS — M729 Fibroblastic disorder, unspecified: Secondary | ICD-10-CM | POA: Diagnosis not present

## 2015-09-19 DIAGNOSIS — M9903 Segmental and somatic dysfunction of lumbar region: Secondary | ICD-10-CM | POA: Diagnosis not present

## 2015-09-19 DIAGNOSIS — M9904 Segmental and somatic dysfunction of sacral region: Secondary | ICD-10-CM | POA: Diagnosis not present

## 2015-09-19 DIAGNOSIS — M5417 Radiculopathy, lumbosacral region: Secondary | ICD-10-CM | POA: Diagnosis not present

## 2015-10-04 ENCOUNTER — Other Ambulatory Visit
Admission: RE | Admit: 2015-10-04 | Discharge: 2015-10-04 | Disposition: A | Discharge status: Home / Self Care | Payer: Medicare Other | Source: Ambulatory Visit | Attending: Dermatology | Admitting: Dermatology

## 2015-10-04 DIAGNOSIS — L298 Other pruritus: Secondary | ICD-10-CM | POA: Insufficient documentation

## 2015-10-04 DIAGNOSIS — L570 Actinic keratosis: Secondary | ICD-10-CM | POA: Diagnosis not present

## 2015-10-04 DIAGNOSIS — L659 Nonscarring hair loss, unspecified: Secondary | ICD-10-CM | POA: Insufficient documentation

## 2015-10-04 LAB — CBC
HEMATOCRIT: 45.1 % (ref 35.0–47.0)
HEMOGLOBIN: 14.8 g/dL (ref 12.0–16.0)
MCH: 30.3 pg (ref 26.0–34.0)
MCHC: 32.9 g/dL (ref 32.0–36.0)
MCV: 92.2 fL (ref 80.0–100.0)
Platelets: 216 10*3/uL (ref 150–440)
RBC: 4.89 MIL/uL (ref 3.80–5.20)
RDW: 14.7 % — ABNORMAL HIGH (ref 11.5–14.5)
WBC: 6.9 10*3/uL (ref 3.6–11.0)

## 2015-10-04 LAB — IRON AND TIBC
IRON: 76 ug/dL (ref 28–170)
Saturation Ratios: 21 % (ref 10.4–31.8)
TIBC: 364 ug/dL (ref 250–450)
UIBC: 288 ug/dL

## 2015-10-04 LAB — TSH: TSH: 4.453 u[IU]/mL (ref 0.350–4.500)

## 2015-10-09 DIAGNOSIS — R001 Bradycardia, unspecified: Secondary | ICD-10-CM | POA: Diagnosis not present

## 2015-10-09 DIAGNOSIS — R079 Chest pain, unspecified: Secondary | ICD-10-CM | POA: Diagnosis not present

## 2015-10-09 DIAGNOSIS — R42 Dizziness and giddiness: Secondary | ICD-10-CM | POA: Diagnosis not present

## 2015-10-09 DIAGNOSIS — R55 Syncope and collapse: Secondary | ICD-10-CM | POA: Diagnosis not present

## 2015-10-09 DIAGNOSIS — Z87898 Personal history of other specified conditions: Secondary | ICD-10-CM | POA: Diagnosis not present

## 2015-10-14 ENCOUNTER — Encounter: Payer: Self-pay | Admitting: Emergency Medicine

## 2015-10-14 ENCOUNTER — Emergency Department
Admission: EM | Admit: 2015-10-14 | Discharge: 2015-10-14 | Disposition: A | Discharge status: Home / Self Care | Payer: Medicare Other | Attending: Student | Admitting: Student

## 2015-10-14 ENCOUNTER — Emergency Department: Payer: Medicare Other

## 2015-10-14 DIAGNOSIS — Z792 Long term (current) use of antibiotics: Secondary | ICD-10-CM | POA: Insufficient documentation

## 2015-10-14 DIAGNOSIS — Z7952 Long term (current) use of systemic steroids: Secondary | ICD-10-CM | POA: Insufficient documentation

## 2015-10-14 DIAGNOSIS — Z79899 Other long term (current) drug therapy: Secondary | ICD-10-CM | POA: Diagnosis not present

## 2015-10-14 DIAGNOSIS — I10 Essential (primary) hypertension: Secondary | ICD-10-CM | POA: Diagnosis not present

## 2015-10-14 DIAGNOSIS — R079 Chest pain, unspecified: Secondary | ICD-10-CM | POA: Insufficient documentation

## 2015-10-14 DIAGNOSIS — Z880 Allergy status to penicillin: Secondary | ICD-10-CM | POA: Insufficient documentation

## 2015-10-14 HISTORY — DX: Diverticulitis of intestine, part unspecified, without perforation or abscess without bleeding: K57.92

## 2015-10-14 HISTORY — DX: Essential (primary) hypertension: I10

## 2015-10-14 HISTORY — DX: Calculus of kidney: N20.0

## 2015-10-14 HISTORY — DX: Rosacea, unspecified: L71.9

## 2015-10-14 LAB — BASIC METABOLIC PANEL
Anion gap: 9 (ref 5–15)
BUN: 21 mg/dL — AB (ref 6–20)
CO2: 27 mmol/L (ref 22–32)
Calcium: 9.4 mg/dL (ref 8.9–10.3)
Chloride: 104 mmol/L (ref 101–111)
Creatinine, Ser: 0.97 mg/dL (ref 0.44–1.00)
GFR calc Af Amer: 57 mL/min — ABNORMAL LOW (ref >60)
GFR, EST NON AFRICAN AMERICAN: 50 mL/min — AB (ref >60)
GLUCOSE: 90 mg/dL (ref 65–99)
POTASSIUM: 4.1 mmol/L (ref 3.5–5.1)
Sodium: 140 mmol/L (ref 135–145)

## 2015-10-14 LAB — CBC
HEMATOCRIT: 43 % (ref 35.0–47.0)
Hemoglobin: 13.9 g/dL (ref 12.0–16.0)
MCH: 29.5 pg (ref 26.0–34.0)
MCHC: 32.3 g/dL (ref 32.0–36.0)
MCV: 91.4 fL (ref 80.0–100.0)
Platelets: 267 10*3/uL (ref 150–440)
RBC: 4.7 MIL/uL (ref 3.80–5.20)
RDW: 14.7 % — AB (ref 11.5–14.5)
WBC: 6.9 10*3/uL (ref 3.6–11.0)

## 2015-10-14 LAB — TROPONIN I: Troponin I: <0.03 ng/mL (ref <0.031)

## 2015-10-14 NOTE — ED Provider Notes (Signed)
[facility] Emergency Department Provider Note  ____________________________________________  Time seen: Approximately 8:04 PM  I have reviewed the triage vital signs and the nursing notes.   HISTORY  Chief Complaint Chest Pain    HPI Tammy Mcdaniel is a 82 y.o. female with history of hypertension, remote mastectomies for treatment of breast cancer who presents for evaluation of 2 weeks of daily left-sided chest pain which is sharp in nature, worsened with exertion, not associated with any shortness of breath, not pleuritic, not associated with any nausea or vomiting, currently mild, worse when she bends over. Patient was seen by her cardiologist last week and told her symptoms were not related to her heart. She reports that initially 2 weeks ago when her pain began she would sometimes have pain in her back however she has not had any back pain in at least a week. She reports that the pain sometimes travels across to the right chest and she describes the pain as "sore" but she reports that she has had pain associated with her mastectomy scars for some time and that pain is chronic. She denies any cough or fever, no vomiting or diarrhea, no leg swelling.   Past Medical History  Diagnosis Date  . Cancer ([place])   . Colitis, ulcerative ([place])   . Kidney stone   . Diverticulitis   . Rosacea   . Hypertension     Diet controlled    There are no active problems to display for this patient.   Past Surgical History  Procedure Laterality Date  . Breast surgery Left   . Abdominal hysterectomy    . Eye surgery      Lens implant  . Mastectomy      Left and Right    Current Outpatient Rx  Name  Route  Sig  Dispense  Refill  . calcium carbonate (OS-CAL) 600 MG TABS tablet   Oral   Take 600 mg by mouth 3 (three) times daily with meals.         Marland Kitchen doxycycline (VIBRA-TABS) 100 MG tablet   Oral   Take 1 tablet (100 mg total) by mouth 2 (two) times daily.   20  tablet   0   . Multiple Vitamins-Minerals (PRESERVISION AREDS 2 PO)   Oral   Take 1 tablet by mouth 2 (two) times daily.         . mupirocin ointment (BACTROBAN) 2 %   Nasal   Place 1 application into the nose 2 (two) times daily.   22 g   0   . predniSONE (STERAPRED UNI-PAK 21 TAB) 10 MG (21) TBPK tablet      Sig 6 tablet day 1, 5 tablets day 2, 4 tablets day 3,,3tablets day 4, 2 tablets day 5, 1 tablet day 6 take all tablets orally   21 tablet   0     Allergies Cephalexin; Ciprofloxacin; Flagyl; Gluten meal; Lisinopril; Naproxen; Other; Prevacid; Penicillins; Pseudoephedrine; Rogaine ; and Sulfa antibiotics  Family History  Problem Relation Age of Onset  . Cancer Mother   . Cancer Sister   . Cancer Sister     Social History Social History  Substance Use Topics  . Smoking status: Never Smoker   . Smokeless tobacco: None  . Alcohol Use: No    Review of Systems Constitutional: No fever/chills Eyes: No visual changes. ENT: No sore throat. Cardiovascular: + chest pain. Respiratory: Denies shortness of breath. Gastrointestinal: No abdominal pain.  No nausea, no vomiting.  No diarrhea.  No constipation. Genitourinary: Negative for dysuria. Musculoskeletal: Negative for back pain. Skin: Negative for rash. Neurological: Negative for headaches, focal weakness or numbness.  10-point ROS otherwise negative.  ____________________________________________   PHYSICAL EXAM:  VITAL SIGNS: ED Triage Vitals  Enc Vitals Group     BP 10/14/15 1712 160/92 mmHg     Pulse Rate 10/14/15 1712 67     Resp 10/14/15 1712 18     Temp 10/14/15 1712 97.9 F (36.6 C)     Temp Source 10/14/15 1712 Oral     SpO2 10/14/15 1712 98 %     Weight 10/14/15 1712 119 lb (53.978 kg)     Height 10/14/15 1712 4\' 10"  (1.473 m)     Head Cir --      Peak Flow --      Pain Score 10/14/15 1715 6     Pain Loc --      Pain Edu? --      Excl. in [place]? --     Constitutional: Alert and  oriented. Well appearing and in no acute distress. Talkative, pleasant. Eyes: Conjunctivae are normal. PERRL. EOMI. Head: Atraumatic. Nose: No congestion/rhinnorhea. Mouth/Throat: Mucous membranes are moist.  Oropharynx non-erythematous. Neck: No stridor.   Cardiovascular: Normal rate, regular rhythm. Grossly normal heart sounds.  Good peripheral circulation. Symmetric radial and PT pulses. Respiratory: Normal respiratory effort.  No retractions. Lungs CTAB. Gastrointestinal: Soft and nontender. No distention. No CVA tenderness. Genitourinary: deferred Musculoskeletal: No lower extremity tenderness nor edema.  No joint effusions. No calf tenderness or asymmetry. Point tenderness in the left anterior chest wall, palpation reproduces her pain. Neurologic:  Normal speech and language. No gross focal neurologic deficits are appreciated. No gait instability. Skin:  Skin is warm, dry and intact. No rash noted. Well-healed mastectomy scars with no overlying rash, induration, erythema. Psychiatric: Mood and affect are normal. Speech and behavior are normal.  ____________________________________________   LABS (all labs ordered are listed, but only abnormal results are displayed)  Labs Reviewed  BASIC METABOLIC PANEL - Abnormal; Notable for the following:    BUN 21 (*)    GFR calc non Af Amer 50 (*)    GFR calc Af Amer 57 (*)    All other components within normal limits  CBC - Abnormal; Notable for the following:    RDW 14.7 (*)    All other components within normal limits  TROPONIN I   ____________________________________________  EKG  ED ECG REPORT I, Joanne Gavel, the attending physician, personally viewed and interpreted this ECG.   Date: 10/14/2015  EKG Time: 16:58  Rate: 66  Rhythm: unchanged from previous tracings; sinus rhythm with first-degree AV block  Axis: normal  Intervals:none  ST&T Change: No acute ST elevation. Q waves in V1 and V2 are chronic. EKG is similar to  the EKG obtained on 05/25/2015  ____________________________________________  RADIOLOGY  CXR  IMPRESSION: Mild cardiomegaly and hyperinflation. No active disease.     ____________________________________________   PROCEDURES  Procedure(s) performed: None  Critical Care performed: No  ____________________________________________   INITIAL IMPRESSION / ASSESSMENT AND PLAN / ED COURSE  Pertinent labs & imaging results that were available during my care of the patient were reviewed by me and considered in my medical decision making (see chart for details).  Hade Dimitriou is a 82 y.o. female with history of hypertension, remote mastectomies for treatment of breast cancer who presents for evaluation of 2 weeks of daily left-sided chest pain. On exam,  she is very well-appearing and in no acute distress. Vital signs are stable, she is afebrile. She does have reproducible left chest wall tenderness to palpation and I suspect her pain may be musculoskeletal in nature. EKG is essentially unchanged from prior. Pain ongoing for 2 weeks, not exertional, no other associated symptoms and troponin is negative. I doubt ACS. Patient is not pleuritic, not associated with shortness of breath, not ripping or tearing in nature and is not radiating to the back and I doubt PE or acute aortic dissection. The patient is very hesitant to take any additional pain medications as she has had bad reactions to some in the past and cannot remember all of their names. We discussed meticulous return precautions and she will follow-up with her primary care doctor tomorrow for assistance with pain management. The patient and her but daughter at bedside are comfortable with the discharge plan. DC home.    ____________________________________________   FINAL CLINICAL IMPRESSION(S) / ED DIAGNOSES  Final diagnoses:  Chest pain, unspecified chest pain type      Joanne Gavel, MD 10/14/15 2129

## 2015-10-14 NOTE — ED Notes (Addendum)
Patient states she has had chest pain, particularly under left breast for approx 2 weeks now. Saw Dr. Saralyn Pilar last week, who told her EKG was normal and he did not suspect it was related to her heart. Patient denies any radiating pain to arms or jaw. Patient states pain does not improve or worsen with activity/movement. Patient states she was very active yesterday and felt fine. Patient states she has been taking "pain medication" (IBU/Tylenol alternating) for neuropathy to chest and back recently. States pain is a soreness across both sides of her chest, sharp intermittently under left breast.

## 2015-10-15 ENCOUNTER — Encounter: Payer: Self-pay | Admitting: Family Medicine

## 2015-10-15 ENCOUNTER — Ambulatory Visit (INDEPENDENT_AMBULATORY_CARE_PROVIDER_SITE_OTHER): Payer: Medicare Other | Admitting: Family Medicine

## 2015-10-15 VITALS — BP 132/76 | HR 64 | Ht <= 58 in | Wt 122.0 lb

## 2015-10-15 DIAGNOSIS — R0789 Other chest pain: Secondary | ICD-10-CM

## 2015-10-15 DIAGNOSIS — S29011D Strain of muscle and tendon of front wall of thorax, subsequent encounter: Secondary | ICD-10-CM

## 2015-10-15 DIAGNOSIS — R071 Chest pain on breathing: Secondary | ICD-10-CM

## 2015-10-15 NOTE — Progress Notes (Signed)
Name: Tammy Mcdaniel   MRN: [id number]    DOB: Feb 03, 1922   Date:10/15/2015       Progress Note  Subjective  Chief Complaint  Chief Complaint  Patient presents with  . Follow-up    seen in ER for chest pain yesterday- was Dx with costochondritis- no pain med given due to "all that you are allergic to, we are afraid to give you something"    Chest Pain  This is a recurrent problem. The current episode started 1 to 4 weeks ago. The onset quality is gradual. The problem occurs every several days. The problem has been waxing and waning. The pain is present in the substernal region and lateral region. The quality of the pain is described as sharp and tightness (left lateral sharp/ substernal tightness ). The pain does not radiate. Pertinent negatives include no abdominal pain, back pain, claudication, cough, diaphoresis, dizziness, exertional chest pressure, fever, hemoptysis, irregular heartbeat, leg pain, lower extremity edema, malaise/fatigue, nausea, near-syncope, numbness, orthopnea, palpitations, PND, shortness of breath, sputum production, syncope, vomiting or weakness. The pain is aggravated by nothing. She has tried acetaminophen for the symptoms. The treatment provided no relief. Past medical history comments: mastectomy Prior diagnostic workup includes echocardiogram.    No problem-specific assessment & plan notes found for this encounter.   Past Medical History  Diagnosis Date  . Cancer ([place])   . Colitis, ulcerative ([place])   . Kidney stone   . Diverticulitis   . Rosacea   . Hypertension     Diet controlled    Past Surgical History  Procedure Laterality Date  . Breast surgery Left   . Abdominal hysterectomy    . Eye surgery      Lens implant  . Mastectomy      Left and Right    Family History  Problem Relation Age of Onset  . Cancer Mother   . Cancer Sister   . Cancer Sister     Social History   Social History  . Marital Status: Single    Spouse Name: N/A  .  Number of Children: N/A  . Years of Education: N/A   Occupational History  . Not on file.   Social History Main Topics  . Smoking status: Never Smoker   . Smokeless tobacco: Not on file  . Alcohol Use: No  . Drug Use: Not on file  . Sexual Activity: Not Currently   Other Topics Concern  . Not on file   Social History Narrative    Allergies  Allergen Reactions  . Cephalexin Diarrhea  . Ciprofloxacin Diarrhea and Other (See Comments)    Internal bleeding  . Flagyl [Metronidazole] Diarrhea  . Gluten Meal Other (See Comments)    Other Reaction: GI Upset  . Lisinopril Diarrhea    Severe diarrhea  . Naproxen Other (See Comments)    RECTAL BLEEDING  . Other Hives  . Prevacid [Lansoprazole] Diarrhea  . Penicillins Rash  . Pseudoephedrine Rash  . Rogaine  [Minoxidil] Rash  . Sulfa Antibiotics Rash     Review of Systems  Constitutional: Negative for fever, malaise/fatigue and diaphoresis.  Respiratory: Negative for cough, hemoptysis, sputum production and shortness of breath.   Cardiovascular: Positive for chest pain. Negative for palpitations, orthopnea, claudication, syncope, PND and near-syncope.  Gastrointestinal: Negative for nausea, vomiting and abdominal pain.  Musculoskeletal: Negative for back pain.  Neurological: Negative for dizziness, weakness and numbness.     Objective  Filed Vitals:   10/15/15 1022  BP:  132/76  Pulse: 64  Height: 4\' 10"  (1.473 m)  Weight: 122 lb (55.339 kg)    Physical Exam  Constitutional: She is well-developed, well-nourished, and in no distress. No distress.  HENT:  Head: Normocephalic and atraumatic.  Right Ear: External ear normal.  Left Ear: External ear normal.  Nose: Nose normal.  Mouth/Throat: Oropharynx is clear and moist.  Eyes: Conjunctivae and EOM are normal. Pupils are equal, round, and reactive to light. Right eye exhibits no discharge. Left eye exhibits no discharge.  Neck: Normal range of motion. Neck supple.  No JVD present. No thyromegaly present.  Cardiovascular: Normal rate, regular rhythm, normal heart sounds and intact distal pulses.  Exam reveals no gallop and no friction rub.   No murmur heard. Pulmonary/Chest: Effort normal and breath sounds normal. She has no wheezes. She has no rales. She exhibits tenderness.  Abdominal: Soft. Bowel sounds are normal. She exhibits no mass. There is no tenderness. There is no guarding.  Musculoskeletal: Normal range of motion. She exhibits no edema.  Lymphadenopathy:    She has no cervical adenopathy.  Neurological: She is alert. She has normal reflexes.  Skin: Skin is warm and dry. She is not diaphoretic.  Psychiatric: Mood and affect normal.  Nursing note and vitals reviewed.     Assessment & Plan  Problem List Items Addressed This Visit    None    Visit Diagnoses    Costochondral chest pain    -  Primary    Intercostal muscle strain, subsequent encounter        tylenol         Dr. [facility]  10/15/2015

## 2015-10-15 NOTE — Patient Instructions (Signed)

## 2015-10-22 DIAGNOSIS — C50812 Malignant neoplasm of overlapping sites of left female breast: Secondary | ICD-10-CM | POA: Diagnosis not present

## 2015-10-24 DIAGNOSIS — M5417 Radiculopathy, lumbosacral region: Secondary | ICD-10-CM | POA: Diagnosis not present

## 2015-10-24 DIAGNOSIS — M9903 Segmental and somatic dysfunction of lumbar region: Secondary | ICD-10-CM | POA: Diagnosis not present

## 2015-10-24 DIAGNOSIS — M729 Fibroblastic disorder, unspecified: Secondary | ICD-10-CM | POA: Diagnosis not present

## 2015-10-24 DIAGNOSIS — M9904 Segmental and somatic dysfunction of sacral region: Secondary | ICD-10-CM | POA: Diagnosis not present

## 2015-11-01 ENCOUNTER — Other Ambulatory Visit: Payer: Self-pay

## 2015-11-01 DIAGNOSIS — R202 Paresthesia of skin: Secondary | ICD-10-CM | POA: Diagnosis not present

## 2015-11-15 DIAGNOSIS — M9903 Segmental and somatic dysfunction of lumbar region: Secondary | ICD-10-CM | POA: Diagnosis not present

## 2015-11-15 DIAGNOSIS — M729 Fibroblastic disorder, unspecified: Secondary | ICD-10-CM | POA: Diagnosis not present

## 2015-11-15 DIAGNOSIS — M9904 Segmental and somatic dysfunction of sacral region: Secondary | ICD-10-CM | POA: Diagnosis not present

## 2015-11-15 DIAGNOSIS — M5417 Radiculopathy, lumbosacral region: Secondary | ICD-10-CM | POA: Diagnosis not present

## 2015-12-02 DIAGNOSIS — L853 Xerosis cutis: Secondary | ICD-10-CM | POA: Diagnosis not present

## 2015-12-02 DIAGNOSIS — L718 Other rosacea: Secondary | ICD-10-CM | POA: Diagnosis not present

## 2015-12-02 DIAGNOSIS — L570 Actinic keratosis: Secondary | ICD-10-CM | POA: Diagnosis not present

## 2015-12-02 DIAGNOSIS — R202 Paresthesia of skin: Secondary | ICD-10-CM | POA: Diagnosis not present

## 2015-12-05 ENCOUNTER — Encounter: Payer: Self-pay | Admitting: Family Medicine

## 2016-01-04 DIAGNOSIS — M9902 Segmental and somatic dysfunction of thoracic region: Secondary | ICD-10-CM | POA: Diagnosis not present

## 2016-01-04 DIAGNOSIS — M729 Fibroblastic disorder, unspecified: Secondary | ICD-10-CM | POA: Diagnosis not present

## 2016-01-04 DIAGNOSIS — M546 Pain in thoracic spine: Secondary | ICD-10-CM | POA: Diagnosis not present

## 2016-01-04 DIAGNOSIS — M9904 Segmental and somatic dysfunction of sacral region: Secondary | ICD-10-CM | POA: Diagnosis not present

## 2016-01-28 ENCOUNTER — Ambulatory Visit (INDEPENDENT_AMBULATORY_CARE_PROVIDER_SITE_OTHER): Payer: Medicare Other | Admitting: Family Medicine

## 2016-01-28 ENCOUNTER — Encounter: Payer: Self-pay | Admitting: Family Medicine

## 2016-01-28 VITALS — BP 120/70 | HR 68 | Ht <= 58 in | Wt 124.0 lb

## 2016-01-28 DIAGNOSIS — W57XXXA Bitten or stung by nonvenomous insect and other nonvenomous arthropods, initial encounter: Secondary | ICD-10-CM | POA: Diagnosis not present

## 2016-01-28 DIAGNOSIS — L03116 Cellulitis of left lower limb: Secondary | ICD-10-CM

## 2016-01-28 DIAGNOSIS — T148 Other injury of unspecified body region: Secondary | ICD-10-CM

## 2016-01-28 DIAGNOSIS — L03313 Cellulitis of chest wall: Secondary | ICD-10-CM | POA: Diagnosis not present

## 2016-01-28 MED ORDER — MUPIROCIN 2 % EX OINT: 1.0000 "application " | TOPICAL_OINTMENT | Freq: Two times a day (BID) | CUTANEOUS | Status: DC

## 2016-01-28 MED ORDER — DOXYCYCLINE HYCLATE 100 MG PO TABS: 100.0000 mg | ORAL_TABLET | Freq: Two times a day (BID) | ORAL | Status: DC

## 2016-01-28 NOTE — Progress Notes (Signed)
Name: Tammy Mcdaniel   MRN: [id number]    DOB: May 22, 1922   Date:01/28/2016       Progress Note  Subjective  Chief Complaint  Chief Complaint  Patient presents with  . Tick Removal    x 2- on chest and leg- has had since the weekend    Rash This is a new problem. The current episode started in the past 7 days. The affected locations include the chest and left lower leg. The rash is characterized by swelling and redness. She was exposed to an insect bite/sting. Pertinent negatives include no cough, diarrhea, fever, joint pain, shortness of breath or sore throat. (No headache) Past treatments include nothing. The treatment provided no relief.    No problem-specific assessment & plan notes found for this encounter.   Past Medical History  Diagnosis Date  . Cancer ([place])   . Colitis, ulcerative ([place])   . Kidney stone   . Diverticulitis   . Rosacea   . Hypertension     Diet controlled    Past Surgical History  Procedure Laterality Date  . Breast surgery Left   . Abdominal hysterectomy    . Eye surgery      Lens implant  . Mastectomy      Left and Right    Family History  Problem Relation Age of Onset  . Cancer Mother   . Cancer Sister   . Cancer Sister     Social History   Social History  . Marital Status: Single    Spouse Name: N/A  . Number of Children: N/A  . Years of Education: N/A   Occupational History  . Not on file.   Social History Main Topics  . Smoking status: Never Smoker   . Smokeless tobacco: Not on file  . Alcohol Use: No  . Drug Use: Not on file  . Sexual Activity: Not Currently   Other Topics Concern  . Not on file   Social History Narrative    Allergies  Allergen Reactions  . Cephalexin Diarrhea  . Ciprofloxacin Diarrhea and Other (See Comments)    Internal bleeding  . Flagyl [Metronidazole] Diarrhea  . Gluten Meal Other (See Comments)    Other Reaction: GI Upset  . Lisinopril Diarrhea    Severe diarrhea  . Naproxen Other (See  Comments)    RECTAL BLEEDING  . Other Hives  . Prevacid [Lansoprazole] Diarrhea  . Penicillins Rash  . Pseudoephedrine Rash  . Rogaine  [Minoxidil] Rash  . Sulfa Antibiotics Rash     Review of Systems  Constitutional: Negative for fever, chills, weight loss and malaise/fatigue.  HENT: Negative for ear discharge, ear pain and sore throat.   Eyes: Negative for blurred vision.  Respiratory: Negative for cough, sputum production, shortness of breath and wheezing.   Cardiovascular: Negative for chest pain, palpitations and leg swelling.  Gastrointestinal: Negative for heartburn, nausea, abdominal pain, diarrhea, constipation, blood in stool and melena.  Genitourinary: Negative for dysuria, urgency, frequency and hematuria.  Musculoskeletal: Negative for myalgias, back pain, joint pain and neck pain.  Skin: Positive for rash.  Neurological: Negative for dizziness, tingling, sensory change, focal weakness and headaches.  Endo/Heme/Allergies: Negative for environmental allergies and polydipsia. Does not bruise/bleed easily.  Psychiatric/Behavioral: Negative for depression and suicidal ideas. The patient is not nervous/anxious and does not have insomnia.      Objective  Filed Vitals:   01/28/16 0936  BP: 120/70  Pulse: 68  Height: 4\' 10"  (1.473 m)  Weight:  124 lb (56.246 kg)    Physical Exam  Constitutional: She is well-developed, well-nourished, and in no distress. No distress.  HENT:  Head: Normocephalic and atraumatic.  Right Ear: External ear normal.  Left Ear: External ear normal.  Nose: Nose normal.  Mouth/Throat: Oropharynx is clear and moist.  Eyes: Conjunctivae and EOM are normal. Pupils are equal, round, and reactive to light. Right eye exhibits no discharge. Left eye exhibits no discharge.  Neck: Normal range of motion. Neck supple. No JVD present. No thyromegaly present.  Cardiovascular: Normal rate, regular rhythm, normal heart sounds and intact distal pulses.  Exam  reveals no gallop and no friction rub.   No murmur heard. Pulmonary/Chest: Effort normal and breath sounds normal.  Abdominal: Soft. Bowel sounds are normal. She exhibits no mass. There is no tenderness. There is no guarding.  Musculoskeletal: Normal range of motion. She exhibits no edema.  Lymphadenopathy:    She has no cervical adenopathy.  Neurological: She is alert.  Skin: Skin is warm and dry. She is not diaphoretic. There is erythema.  Two attached dog ticks  Psychiatric: Mood and affect normal.  Nursing note and vitals reviewed.     Assessment & Plan  Problem List Items Addressed This Visit    None    Visit Diagnoses    Tick bites    -  Primary    cleaned with betadine/ removal with forceps/ dressing    Relevant Medications    doxycycline (VIBRA-TABS) 100 MG tablet    mupirocin ointment (BACTROBAN) 2 %    Cellulitis of left lower extremity        Cellulitis of chest wall          Removed 2 ticks- put antibiotic ointment and dressing on areas   Dr. Deanna Jones [place]  01/28/2016

## 2016-02-10 DIAGNOSIS — S1086XA Insect bite of other specified part of neck, initial encounter: Secondary | ICD-10-CM | POA: Diagnosis not present

## 2016-03-01 ENCOUNTER — Ambulatory Visit
Admission: EM | Admit: 2016-03-01 | Discharge: 2016-03-01 | Disposition: A | Discharge status: Home / Self Care | Payer: Medicare Other | Attending: Family Medicine | Admitting: Family Medicine

## 2016-03-01 ENCOUNTER — Encounter: Payer: Self-pay | Admitting: Gynecology

## 2016-03-01 DIAGNOSIS — M5441 Lumbago with sciatica, right side: Secondary | ICD-10-CM | POA: Diagnosis not present

## 2016-03-01 MED ORDER — HYDROCODONE-ACETAMINOPHEN 2.5-325 MG PO TABS: ORAL_TABLET | ORAL | Status: DC

## 2016-03-01 NOTE — Discharge Instructions (Signed)
Use the medication as needed.  Please follow up with Dr. Ronnald Ramp. If you pain continues I would recommend imaging.  Take care   Dr. Lacinda Axon

## 2016-03-01 NOTE — ED Provider Notes (Signed)
CSN: [id number]     Arrival date & time 03/01/16  0919 History   First MD Initiated Contact with Patient 03/01/16 [phone]     Chief Complaint  Patient presents with  . Leg Pain   (Consider location/radiation/quality/duration/timing/severity/associated sxs/prior Treatment) HPI  82 year old female presents with complaints of right lower leg pain. She's also had recent back pain.  Patient states that she was on her feet for an extended period of time at church on Saturday. She states that after she went home she had severe back pain and also had pain in her right lower leg. She states the pain has been in her calf, behind the knee, and extending proximally. She's taken some ibuprofen and stretch the area and has had some improvement. However, she states that she continues to have significant pain. She states that she has a history of back pain and sciatica. No recent fall, trauma, injury. No other complaints at this time.  Past Medical History  Diagnosis Date  . Cancer ([place])   . Colitis, ulcerative ([place])   . Kidney stone   . Diverticulitis   . Rosacea   . Hypertension     Diet controlled   Past Surgical History  Procedure Laterality Date  . Breast surgery Left   . Abdominal hysterectomy    . Eye surgery      Lens implant  . Mastectomy      Left and Right   Family History  Problem Relation Age of Onset  . Cancer Mother   . Cancer Sister   . Cancer Sister    Social History  Substance Use Topics  . Smoking status: Never Smoker   . Smokeless tobacco: None  . Alcohol Use: No   OB History    No data available     Review of Systems  Constitutional: Negative.   Musculoskeletal: Positive for back pain.   Allergies  Cephalexin; Ciprofloxacin; Flagyl; Gluten meal; Lisinopril; Naproxen; Other; Prevacid; Penicillins; Pseudoephedrine; Rogaine ; and Sulfa antibiotics  Home Medications   Prior to Admission medications   Medication Sig Start Date End Date Taking? Authorizing  Provider  calcium carbonate (OS-CAL) 600 MG TABS tablet Take 600 mg by mouth 3 (three) times daily with meals.   Yes Historical Provider, MD  Multiple Vitamins-Minerals (PRESERVISION AREDS 2 PO) Take 1 tablet by mouth 2 (two) times daily.   Yes Historical Provider, MD  clobetasol (TEMOVATE) 0.05 % external solution Apply 1 application topically 2 (two) times daily.    Historical Provider, MD  doxycycline (VIBRA-TABS) 100 MG tablet Take 1 tablet (100 mg total) by mouth 2 (two) times daily. 01/28/16   Juline Patch, MD  Hydrocodone-Acetaminophen 2.5-325 MG TABS 1 tablet every 8 hours as needed. 03/01/16   Coral Spikes, DO  mupirocin ointment (BACTROBAN) 2 % Place 1 application into the nose 2 (two) times daily. 01/28/16   Juline Patch, MD   Meds Ordered and Administered this Visit  Medications - No data to display  BP 158/60 mmHg  Pulse 60  Temp(Src) 98 F (36.7 C) (Oral)  Resp 16  Ht 4\' 10"  (1.473 m)  Wt 119 lb (53.978 kg)  BMI 24.88 kg/m2  SpO2 100% No data found.  Physical Exam  Constitutional: She is oriented to person, place, and time. She appears well-developed. No distress.  Cardiovascular: Normal rate and regular rhythm.   Pulmonary/Chest: Effort normal and breath sounds normal.  Musculoskeletal:  Back/R lower leg - lumbar spine without tenderness to palpation.  Negative straight leg raise. No evidence of right knee effusion. No calf tenderness. No cords appreciated. Negative Homans sign.  Neurological: She is alert and oriented to person, place, and time.  Psychiatric: She has a normal mood and affect.  Vitals reviewed.  ED Course  Procedures (including critical care time)  Labs Review Labs Reviewed - No data to display  Imaging Review No results found.  MDM   1. Right-sided low back pain with right-sided sciatica    82 year old female with a history of back pain presents with recent low back pain and right lower leg pain. I suspect sciatica. However, could just be  MSK in etiology. No red flags on exam. No indication for imaging at this time. Advised supportive care and Motrin. Vicodin (low dose; 2.5) was given for pain. She states she's not tolerated tramadol the past. I encouraged her to follow-up the primary if this continues at which time there should be consideration for imaging.    Coral Spikes, DO 03/01/16 [phone]

## 2016-03-01 NOTE — ED Notes (Signed)
Patient c/o right leg pain x yesterday.

## 2016-03-02 DIAGNOSIS — M9903 Segmental and somatic dysfunction of lumbar region: Secondary | ICD-10-CM | POA: Diagnosis not present

## 2016-03-02 DIAGNOSIS — M5431 Sciatica, right side: Secondary | ICD-10-CM | POA: Diagnosis not present

## 2016-03-02 DIAGNOSIS — M9904 Segmental and somatic dysfunction of sacral region: Secondary | ICD-10-CM | POA: Diagnosis not present

## 2016-03-02 DIAGNOSIS — M729 Fibroblastic disorder, unspecified: Secondary | ICD-10-CM | POA: Diagnosis not present

## 2016-03-03 ENCOUNTER — Ambulatory Visit: Payer: Medicare Other

## 2016-03-03 ENCOUNTER — Encounter: Payer: Self-pay | Admitting: *Deleted

## 2016-03-03 ENCOUNTER — Ambulatory Visit
Admission: EM | Admit: 2016-03-03 | Discharge: 2016-03-03 | Disposition: A | Discharge status: Home / Self Care | Payer: Medicare Other | Attending: Family Medicine | Admitting: Family Medicine

## 2016-03-03 DIAGNOSIS — Z880 Allergy status to penicillin: Secondary | ICD-10-CM | POA: Diagnosis not present

## 2016-03-03 DIAGNOSIS — Z882 Allergy status to sulfonamides status: Secondary | ICD-10-CM | POA: Diagnosis not present

## 2016-03-03 DIAGNOSIS — Z888 Allergy status to other drugs, medicaments and biological substances status: Secondary | ICD-10-CM | POA: Diagnosis not present

## 2016-03-03 DIAGNOSIS — M545 Low back pain, unspecified: Secondary | ICD-10-CM

## 2016-03-03 DIAGNOSIS — I10 Essential (primary) hypertension: Secondary | ICD-10-CM | POA: Diagnosis not present

## 2016-03-03 DIAGNOSIS — S3992XA Unspecified injury of lower back, initial encounter: Secondary | ICD-10-CM | POA: Diagnosis not present

## 2016-03-03 DIAGNOSIS — M546 Pain in thoracic spine: Secondary | ICD-10-CM | POA: Insufficient documentation

## 2016-03-03 DIAGNOSIS — W57XXXA Bitten or stung by nonvenomous insect and other nonvenomous arthropods, initial encounter: Secondary | ICD-10-CM

## 2016-03-03 MED ORDER — ONDANSETRON 4 MG PO TBDP
4.0000 mg | ORAL_TABLET | Freq: Once | ORAL | Status: AC
  Administered 2016-03-03: 4 mg via ORAL

## 2016-03-03 MED ORDER — MUPIROCIN 2 % EX OINT: 1.0000 "application " | TOPICAL_OINTMENT | Freq: Three times a day (TID) | CUTANEOUS | Status: DC

## 2016-03-03 MED ORDER — KETOROLAC TROMETHAMINE 60 MG/2ML IM SOLN
30.0000 mg | Freq: Once | INTRAMUSCULAR | Status: AC
  Administered 2016-03-03: 30 mg via INTRAMUSCULAR

## 2016-03-03 MED ORDER — ONDANSETRON 8 MG PO TBDP: 8.0000 mg | ORAL_TABLET | Freq: Once | ORAL | Status: DC

## 2016-03-03 NOTE — ED Notes (Signed)
Patient fell today while trying to sit in a wheel chair that she forgot to lock. She reports hurting her back during the fall.

## 2016-03-03 NOTE — Discharge Instructions (Signed)
Back Pain, Adult Back pain is very common. The pain often gets better over time. The cause of back pain is usually not dangerous. Most people can learn to manage their back pain on their own.  HOME CARE  Watch your back pain for any changes. The following actions may help to lessen any pain you are feeling:  Stay active. Start with short walks on flat ground if you can. Try to walk farther each day.  Exercise regularly as told by your doctor. Exercise helps your back heal faster. It also helps avoid future injury by keeping your muscles strong and flexible.  Do not sit, drive, or stand in one place for more than 30 minutes.  Do not stay in bed. Resting more than 1-2 days can slow down your recovery.  Be careful when you bend or lift an object. Use good form when lifting:  Bend at your knees.  Keep the object close to your body.  Do not twist.  Sleep on a firm mattress. Lie on your side, and bend your knees. If you lie on your back, put a pillow under your knees.  Take medicines only as told by your doctor.  Put ice on the injured area.  Put ice in a plastic bag.  Place a towel between your skin and the bag.  Leave the ice on for 20 minutes, 2-3 times a day for the first 2-3 days. After that, you can switch between ice and heat packs.  Avoid feeling anxious or stressed. Find good ways to deal with stress, such as exercise.  Maintain a healthy weight. Extra weight puts stress on your back. GET HELP IF:   You have pain that does not go away with rest or medicine.  You have worsening pain that goes down into your legs or buttocks.  You have pain that does not get better in one week.  You have pain at night.  You lose weight.  You have a fever or chills. GET HELP RIGHT AWAY IF:   You cannot control when you poop (bowel movement) or pee (urinate).  Your arms or legs feel weak.  Your arms or legs lose feeling (numbness).  You feel sick to your stomach (nauseous) or  throw up (vomit).  You have belly (abdominal) pain.  You feel like you may pass out (faint).   This information is not intended to replace advice given to you by your health care provider. Make sure you discuss any questions you have with your health care provider.   Document Released: 02/10/2008 Document Revised: 09/14/2014 Document Reviewed: 12/26/2013 Elsevier Interactive Patient Education 2016 [employer] Inc[employer]  Chronic Back Pain  When back pain lasts longer than 3 months, it is called chronic back pain.People with chronic back pain often go through certain periods that are more intense (flare-ups).  CAUSES Chronic back pain can be caused by wear and tear (degeneration) on different structures in your back. These structures include:  The bones of your spine (vertebrae) and the joints surrounding your spinal cord and nerve roots (facets).  The strong, fibrous tissues that connect your vertebrae (ligaments). Degeneration of these structures may result in pressure on your nerves. This can lead to constant pain. HOME CARE INSTRUCTIONS  Avoid bending, heavy lifting, prolonged sitting, and activities which make the problem worse.  Take brief periods of rest throughout the day to reduce your pain. Lying down or standing usually is better than sitting while you are resting.  Take over-the-counter or prescription medicines  only as directed by your caregiver. SEEK IMMEDIATE MEDICAL CARE IF:   You have weakness or numbness in one of your legs or feet.  You have trouble controlling your bladder or bowels.  You have nausea, vomiting, abdominal pain, shortness of breath, or fainting.   This information is not intended to replace advice given to you by your health care provider. Make sure you discuss any questions you have with your health care provider.   Document Released: 10/01/2004 Document Revised: 11/16/2011 Document Reviewed: 02/11/2015 Elsevier Interactive Patient Education [employer]

## 2016-03-03 NOTE — ED Notes (Signed)
Patient waited 15 minutes and no reactions were noted. Injection site is unremarkable. 

## 2016-03-03 NOTE — ED Provider Notes (Addendum)
CSN: [id number]     Arrival date & time 03/03/16  1448 History   First MD Initiated Contact with Patient 03/03/16 1555    Nurses notes were reviewed. Chief Complaint  Patient presents with  . Fall    Patient reports being seen by Dr. Lacinda Axon on Sunday for her low back pain. She also reports that she was given half a tablet of Vicodin use for pain. She reports using a wheelchair to help with ambulation because the back pain and she did not like the chair and she subsequently fell [place] to sit in the chair this morning. She denies any loss of consciousness she states that she was down on the floor for a while and now her lumbar spine and her thoracic spine area hurts as well. She has a history of breast surgery abdominal hysterectomy eye surgery and a mastectomy (right breast. History kidney stones Dr. colitis and ulcerative colitis as well as cancer.  Never smoked. No pertinent family medical history obtained today's visit.  (Consider location/radiation/quality/duration/timing/severity/associated sxs/prior Treatment) Patient is a 82 y.o. female presenting with fall. The history is provided by the patient. No language interpreter was used.  Fall This is a new problem. The current episode started 3 to 5 hours ago. The problem has not changed since onset.Pertinent negatives include no chest pain and no abdominal pain. The symptoms are aggravated by walking and exertion. Nothing relieves the symptoms. She has tried nothing for the symptoms. The treatment provided no relief.    Past Medical History  Diagnosis Date  . Cancer ([place])   . Colitis, ulcerative ([place])   . Kidney stone   . Diverticulitis   . Rosacea   . Hypertension     Diet controlled   Past Surgical History  Procedure Laterality Date  . Breast surgery Left   . Abdominal hysterectomy    . Eye surgery      Lens implant  . Mastectomy      Left and Right   Family History  Problem Relation Age of Onset  . Cancer Mother   . Cancer  Sister   . Cancer Sister    Social History  Substance Use Topics  . Smoking status: Never Smoker   . Smokeless tobacco: Never Used  . Alcohol Use: No   OB History    No data available     Review of Systems  Constitutional: Positive for activity change.  Cardiovascular: Negative for chest pain.  Gastrointestinal: Negative for abdominal pain.  Musculoskeletal: Positive for myalgias and back pain.  All other systems reviewed and are negative.   Allergies  Cephalexin; Ciprofloxacin; Flagyl; Gluten meal; Lisinopril; Naproxen; Other; Prevacid; Penicillins; Pseudoephedrine; Rogaine ; and Sulfa antibiotics  Home Medications   Prior to Admission medications   Medication Sig Start Date End Date Taking? Authorizing Provider  calcium carbonate (OS-CAL) 600 MG TABS tablet Take 600 mg by mouth 3 (three) times daily with meals.   Yes Historical Provider, MD  clobetasol (TEMOVATE) 0.05 % external solution Apply 1 application topically 2 (two) times daily.   Yes Historical Provider, MD  doxycycline (VIBRA-TABS) 100 MG tablet Take 1 tablet (100 mg total) by mouth 2 (two) times daily. 01/28/16  Yes Juline Patch, MD  Hydrocodone-Acetaminophen 2.5-325 MG TABS 1 tablet every 8 hours as needed. 03/01/16  Yes Coral Spikes, DO  Multiple Vitamins-Minerals (PRESERVISION AREDS 2 PO) Take 1 tablet by mouth 2 (two) times daily.   Yes Historical Provider, MD  mupirocin ointment Drue Stager)  2 % Place 1 application into the nose 3 (three) times daily. 03/03/16   Frederich Cha, MD   Meds Ordered and Administered this Visit   Medications  ondansetron (ZOFRAN-ODT) disintegrating tablet 4 mg (4 mg Oral Given 03/03/16 1601)  ketorolac (TORADOL) injection 30 mg (30 mg Intramuscular Given 03/03/16 1651)    BP 130/82 mmHg  Pulse 63  Temp(Src) 98.1 F (36.7 C) (Tympanic)  Resp 18  Ht 4\' 10"  (1.473 m)  Wt 112 lb (50.803 kg)  BMI 23.41 kg/m2  SpO2 100% No data found.   Physical Exam  Constitutional: She is  oriented to person, place, and time. She appears well-developed and well-nourished.  HENT:  Head: Normocephalic and atraumatic.  Eyes: Conjunctivae are normal. Pupils are equal, round, and reactive to light.  Neck: Normal range of motion.  Musculoskeletal: She exhibits tenderness.       Arms: She has a marked abrasions over the thoracic spine area both sides and low area of irritation near the lumbar spine. She has some diffuse tenderness present over the lumbar and thoracic spine as well.  Neurological: She is alert and oriented to person, place, and time.  Skin: Skin is warm.  Psychiatric: She has a normal mood and affect. Her behavior is normal.  Vitals reviewed.   ED Course  Procedures (including critical care time)  Labs Review Labs Reviewed - No data to display  Imaging Review Dg Thoracic Spine 2 View  03/03/2016  CLINICAL DATA:  False morning with upper back pain, initial encounter EXAM: THORACIC SPINE 2 VIEWS COMPARISON:  10/14/2015 FINDINGS: Mild osteophytic changes are noted. Vertebral body height is well maintained. Pedicles are within normal limits. No paraspinal mass lesion is seen. No gross soft tissue abnormality is noted. IMPRESSION: No acute abnormality seen. Electronically Signed   By: Inez Catalina M.D.   On: 03/03/2016 16:52   Dg Lumbar Spine Complete  03/03/2016  CLINICAL DATA:  Fall this morning, right leg pain, sciatica, back injury EXAM: LUMBAR SPINE - COMPLETE 4+ VIEW COMPARISON:  07/02/2014 and 06/24/2013 FINDINGS: Five views of the lumbar spine submitted. There is diffuse osteopenia. No acute fracture or subluxation. Stable minimal compression deformity upper endplate of L2 vertebral body. There is moderate disc space flattening with anterior spurring at L1-L2 level. Mild disc space flattening with moderate anterior spurring at L2-L3 level. Significant disc space flattening with vacuum disc phenomenon and anterior spurring at L3-L4 level. Mild disc space flattening  at L4-L5 and L5-S1 level. Facet degenerative changes noted L4 and L5 level. Mild lumbar dextroscoliosis. IMPRESSION: No acute fracture or subluxation. Osteoarthritic changes as described above. Diffuse osteopenia. Stable minimal compression deformity upper endplate of L2 vertebral body. Electronically Signed   By: Lahoma Crocker M.D.   On: 03/03/2016 16:52     Visual Acuity Review  Right Eye Distance:   Left Eye Distance:   Bilateral Distance:    Right Eye Near:   Left Eye Near:    Bilateral Near:         MDM   1. Midline thoracic back pain   2. Lumbar spine pain   3. Tick bites    Patient given 30 mg Toradol IM and Zofran 4 mg and she has nausea because the pain when she got here. She states that the Vicodin given to her by Dr. Lacinda Axon half a tablet is not doing anything for the pain. When asked is causing sedation she states she's not becoming sedated all because of it. I'll suggest  that she increase it to one whole tablet every 8 hours for the pain. Over the Toradol at the lower dose 30 mg will help with the discomfort. Should be noted that there is no signs of any fracture she does have osteopenia and arthritis of the lumbar spine and is some stable minimal compression deformity at L2 vertebral body is well.  Note: This dictation was prepared with Dragon dictation along with smaller phrase technology. Any transcriptional errors that result from this process are unintentional.  Frederich Cha, MD 03/03/16 [place], MD 03/03/16 [phone]

## 2016-03-04 DIAGNOSIS — L218 Other seborrheic dermatitis: Secondary | ICD-10-CM | POA: Diagnosis not present

## 2016-03-04 DIAGNOSIS — D485 Neoplasm of uncertain behavior of skin: Secondary | ICD-10-CM | POA: Diagnosis not present

## 2016-03-04 DIAGNOSIS — L500 Allergic urticaria: Secondary | ICD-10-CM | POA: Diagnosis not present

## 2016-03-05 DIAGNOSIS — M5431 Sciatica, right side: Secondary | ICD-10-CM | POA: Diagnosis not present

## 2016-03-05 DIAGNOSIS — M729 Fibroblastic disorder, unspecified: Secondary | ICD-10-CM | POA: Diagnosis not present

## 2016-03-05 DIAGNOSIS — M9904 Segmental and somatic dysfunction of sacral region: Secondary | ICD-10-CM | POA: Diagnosis not present

## 2016-03-05 DIAGNOSIS — M9903 Segmental and somatic dysfunction of lumbar region: Secondary | ICD-10-CM | POA: Diagnosis not present

## 2016-03-12 ENCOUNTER — Emergency Department
Admission: EM | Admit: 2016-03-12 | Discharge: 2016-03-12 | Disposition: A | Discharge status: Home / Self Care | Payer: Medicare Other | Attending: Emergency Medicine | Admitting: Emergency Medicine

## 2016-03-12 ENCOUNTER — Encounter: Payer: Self-pay | Admitting: Emergency Medicine

## 2016-03-12 ENCOUNTER — Emergency Department: Payer: Medicare Other

## 2016-03-12 DIAGNOSIS — W19XXXA Unspecified fall, initial encounter: Secondary | ICD-10-CM

## 2016-03-12 DIAGNOSIS — S300XXA Contusion of lower back and pelvis, initial encounter: Secondary | ICD-10-CM | POA: Insufficient documentation

## 2016-03-12 DIAGNOSIS — W07XXXA Fall from chair, initial encounter: Secondary | ICD-10-CM | POA: Diagnosis not present

## 2016-03-12 DIAGNOSIS — Y9389 Activity, other specified: Secondary | ICD-10-CM | POA: Diagnosis not present

## 2016-03-12 DIAGNOSIS — S20222A Contusion of left back wall of thorax, initial encounter: Secondary | ICD-10-CM

## 2016-03-12 DIAGNOSIS — I10 Essential (primary) hypertension: Secondary | ICD-10-CM | POA: Insufficient documentation

## 2016-03-12 DIAGNOSIS — M546 Pain in thoracic spine: Secondary | ICD-10-CM | POA: Diagnosis not present

## 2016-03-12 DIAGNOSIS — M549 Dorsalgia, unspecified: Secondary | ICD-10-CM | POA: Diagnosis not present

## 2016-03-12 DIAGNOSIS — Y929 Unspecified place or not applicable: Secondary | ICD-10-CM | POA: Diagnosis not present

## 2016-03-12 DIAGNOSIS — M545 Low back pain: Secondary | ICD-10-CM | POA: Diagnosis present

## 2016-03-12 DIAGNOSIS — S299XXA Unspecified injury of thorax, initial encounter: Secondary | ICD-10-CM | POA: Diagnosis not present

## 2016-03-12 DIAGNOSIS — Y999 Unspecified external cause status: Secondary | ICD-10-CM | POA: Diagnosis not present

## 2016-03-12 NOTE — ED Notes (Signed)
Pt arrived from home by EMS, post mechanical fall as pt was trying to get out of a recliner when her power went out. Pt c/o of thoracic back pain. Pt denies LOC.

## 2016-03-12 NOTE — Discharge Instructions (Signed)

## 2016-03-12 NOTE — ED Provider Notes (Signed)
[facility] Emergency Department Provider Note  ____________________________________________  Time seen: Approximately 4:41 AM  I have reviewed the triage vital signs and the nursing notes.   HISTORY  Chief Complaint Fall    HPI Tammy Mcdaniel is a 82 y.o. female with a history that includes mid-thoracic and lower back pain who presents by EMS for evaluation of acute onset mid-back pain after a mechanical fall.  She reports that she was in her motorized recliner when the power went out.  When she tried to get up, she struggled because of the chair not retracting as it is supposed to, so she somehow fell.  She did not strike her head and did not lose consciousness.  She denies headache, neck pain, and lower back pain, but she felt acute onset of pain in the upper middle part of her back, primarily to the left of the spine.  She told paramedics that she is concerned something might be broken.  She is in no acute distress upon arrival and is alert and oriented and smiling and conversant with all of [place].  She denies chest pain, shortness of breath, abdominal pain, hip pain, and any pain in any of her extremities.  She reports the pain in her back is moderate and aching.  Nothing makes it better and moving makes a little bit worse.   Past Medical History  Diagnosis Date  . Cancer ([place])   . Colitis, ulcerative ([place])   . Kidney stone   . Diverticulitis   . Rosacea   . Hypertension     Diet controlled    There are no active problems to display for this patient.   Past Surgical History  Procedure Laterality Date  . Breast surgery Left   . Abdominal hysterectomy    . Eye surgery      Lens implant  . Mastectomy      Left and Right    Current Outpatient Rx  Name  Route  Sig  Dispense  Refill  . calcium carbonate (OS-CAL) 600 MG TABS tablet   Oral   Take 600 mg by mouth 3 (three) times daily with meals.         . clobetasol (TEMOVATE) 0.05 % external  solution   Topical   Apply 1 application topically 2 (two) times daily.         Marland Kitchen doxycycline (VIBRA-TABS) 100 MG tablet   Oral   Take 1 tablet (100 mg total) by mouth 2 (two) times daily.   20 tablet   0   . Hydrocodone-Acetaminophen 2.5-325 MG TABS      1 tablet every 8 hours as needed.   20 tablet   0   . Multiple Vitamins-Minerals (PRESERVISION AREDS 2 PO)   Oral   Take 1 tablet by mouth 2 (two) times daily.         . mupirocin ointment (BACTROBAN) 2 %   Topical   Apply 1 application topically 3 (three) times daily.   22 g   0     Allergies Cephalexin; Ciprofloxacin; Flagyl; Gluten meal; Lisinopril; Naproxen; Other; Prevacid; Penicillins; Pseudoephedrine; Rogaine ; and Sulfa antibiotics  Family History  Problem Relation Age of Onset  . Cancer Mother   . Cancer Sister   . Cancer Sister     Social History Social History  Substance Use Topics  . Smoking status: Never Smoker   . Smokeless tobacco: Never Used  . Alcohol Use: No    Review of  Systems Constitutional: No fever/chills Eyes: No visual changes. ENT: No sore throat. Cardiovascular: Denies chest pain. Respiratory: Denies shortness of breath. Gastrointestinal: No abdominal pain.  No nausea, no vomiting.  No diarrhea.  No constipation. Genitourinary: Negative for dysuria. Musculoskeletal: Pain left of center in upper middle back Skin: Negative for rash. Neurological: Negative for headaches, focal weakness or numbness.  10-point ROS otherwise negative.  ____________________________________________   PHYSICAL EXAM:  VITAL SIGNS: ED Triage Vitals  Enc Vitals Group     BP 03/12/16 0438 160/80 mmHg     Pulse Rate 03/12/16 0438 82     Resp 03/12/16 0438 15     Temp 03/12/16 0438 98.6 F (37 C)     Temp Source 03/12/16 0438 Oral     SpO2 03/12/16 0438 100 %     Weight 03/12/16 0438 119 lb (53.978 kg)     Height 03/12/16 0438 4\' 10"  (1.473 m)     Head Cir --      Peak Flow --      Pain  Score 03/12/16 0439 6     Pain Loc --      Pain Edu? --      Excl. in [place]? --     Constitutional: Alert and oriented. Well appearing and in no acute distress. Eyes: Conjunctivae are normal. PERRL. EOMI. Head: Atraumatic. Nose: No congestion/rhinnorhea. Mouth/Throat: Mucous membranes are moist.  Oropharynx non-erythematous. Neck: No stridor.  No meningeal signs.  No cervical spine tenderness to palpation. Cardiovascular: Normal rate, regular rhythm. Good peripheral circulation. Grossly normal heart sounds.   Respiratory: Normal respiratory effort.  No retractions. Lungs CTAB. Gastrointestinal: Soft and nontender. No distention.  Musculoskeletal: No tenderness to palpation, stepoffs, nor deformities throughout entire length of spine.  Mild TTP of the soft tissues of the left upper thoracic paraspinal muscles.  No lower extremity tenderness nor edema. No gross deformities of extremities.  Full ROM of all extremities. Neurologic:  Normal speech and language. No gross focal neurologic deficits are appreciated.  Skin:  Skin is warm, dry and intact. No rash noted. No abrasions, bruising, nor hematomas Psychiatric: Mood and affect are normal. Speech and behavior are normal.  ____________________________________________   LABS (all labs ordered are listed, but only abnormal results are displayed)  Labs Reviewed - No data to display ____________________________________________  EKG  None ____________________________________________  RADIOLOGY   Dg Thoracic Spine 2 View  03/12/2016  CLINICAL DATA:  Status post fall, with left upper back pain. Initial encounter. EXAM: THORACIC SPINE 2 VIEWS COMPARISON:  Thoracic spine radiographs performed 03/03/2016 FINDINGS: There is no evidence of fracture or subluxation. Vertebral bodies demonstrate normal height and alignment. Mild degenerative change is noted along the cervical spine. The visualized portions of both lungs are clear. The mediastinum is  unremarkable in appearance. Postoperative change is noted about the upper abdomen. IMPRESSION: No evidence of fracture or subluxation along the thoracic spine. Electronically Signed   By: Garald Balding M.D.   On: 03/12/2016 05:13    ____________________________________________   PROCEDURES  Procedure(s) performed:   Procedures   ____________________________________________   INITIAL IMPRESSION / ASSESSMENT AND PLAN / ED COURSE  Pertinent labs & imaging results that were available during my care of the patient were reviewed by me and considered in my medical decision making (see chart for details).  Low suspicion for bony injury, and radiographs were unremarkable.  Patient rested comfortably in no acute distress.  The patient is ambulatory without difficulty, got dressed on her own,  and states she is ready to go home.  She is happy and in no distress.  I gave my usual and customary return precautions.   ____________________________________________  FINAL CLINICAL IMPRESSION(S) / ED DIAGNOSES  Final diagnoses:  Fall, initial encounter  Back contusion, left, initial encounter     MEDICATIONS GIVEN DURING THIS VISIT:  Medications - No data to display   NEW OUTPATIENT MEDICATIONS STARTED DURING THIS VISIT:  New Prescriptions   No medications on file      Note:  This document was prepared using Dragon voice recognition software and may include unintentional dictation errors.   Hinda Kehr, MD 03/12/16 [phone]

## 2016-03-12 NOTE — ED Notes (Signed)
Pt asked if she was going to be discharged. Pt informed that provider has not discharged her yet. Pt also inquires about her bag she came in with. Pt assisted to locate the bag in the room.

## 2016-03-19 DIAGNOSIS — M5431 Sciatica, right side: Secondary | ICD-10-CM | POA: Diagnosis not present

## 2016-03-19 DIAGNOSIS — M729 Fibroblastic disorder, unspecified: Secondary | ICD-10-CM | POA: Diagnosis not present

## 2016-03-19 DIAGNOSIS — M9903 Segmental and somatic dysfunction of lumbar region: Secondary | ICD-10-CM | POA: Diagnosis not present

## 2016-03-19 DIAGNOSIS — M9904 Segmental and somatic dysfunction of sacral region: Secondary | ICD-10-CM | POA: Diagnosis not present

## 2016-03-25 ENCOUNTER — Encounter: Payer: Self-pay | Admitting: Family Medicine

## 2016-03-25 ENCOUNTER — Ambulatory Visit (INDEPENDENT_AMBULATORY_CARE_PROVIDER_SITE_OTHER): Payer: Medicare Other | Admitting: Family Medicine

## 2016-03-25 VITALS — BP 124/72 | HR 56 | Temp 98.1°F | Resp 16 | Ht <= 58 in | Wt 121.0 lb

## 2016-03-25 DIAGNOSIS — Z9109 Other allergy status, other than to drugs and biological substances: Secondary | ICD-10-CM | POA: Diagnosis not present

## 2016-03-25 DIAGNOSIS — Z889 Allergy status to unspecified drugs, medicaments and biological substances status: Secondary | ICD-10-CM

## 2016-03-25 DIAGNOSIS — M5430 Sciatica, unspecified side: Secondary | ICD-10-CM

## 2016-03-25 NOTE — Progress Notes (Signed)
Name: Tammy Mcdaniel   MRN: [id number]    DOB: [birth year]   Date:03/25/2016       Progress Note  Subjective  Chief Complaint  Chief Complaint  Patient presents with  . Fall    Pt has had many falls recently (Urgent care visits in EMR)   . OTHER    Wants recommendation for medication - takes NSAIDs and tylenol for sciatic nerve pain but is afraid of side effect of long term use   . Hair/Scalp Problem    Itching of scalp   . OTHER    Pt states she no longer feels safe living alone so she is making arrangements for a retirement home but will take over a year   . Sciatica  . Referral    Wants referral to allergist     Fall The accident occurred more than 1 week ago. The volume of blood lost was moderate. The pain is present in the back. Pertinent negatives include no abdominal pain, bowel incontinence, fever, headaches, hearing loss, hematuria, loss of consciousness, nausea, numbness, tingling, visual change or vomiting. She has tried NSAID (opiods) for the symptoms. The treatment provided mild relief.  Allergic Reaction This is a chronic problem. The problem has been waxing and waning since onset. The problem is mild. It is unknown what she was exposed to. Associated symptoms include itching and a rash. Pertinent negatives include no abdominal pain, chest pain, chest pressure, coughing, diarrhea, difficulty breathing, drooling, eye itching, eye redness, eye watering, globus sensation, hyperventilation, stridor, trouble swallowing, vomiting or wheezing. There is no swelling present.  Back Pain This is a chronic problem. The current episode started more than 1 year ago. The problem occurs intermittently. The problem has been waxing and waning since onset. The symptoms are aggravated by bending. Pertinent negatives include no abdominal pain, bowel incontinence, chest pain, dysuria, fever, headaches, numbness, tingling, weakness or weight loss.    No problem-specific assessment & plan notes found  for this encounter.   Past Medical History  Diagnosis Date  . Cancer ([place])   . Colitis, ulcerative ([place])   . Kidney stone   . Diverticulitis   . Rosacea   . Hypertension     Diet controlled    Past Surgical History  Procedure Laterality Date  . Breast surgery Left   . Abdominal hysterectomy    . Eye surgery      Lens implant  . Mastectomy      Left and Right    Family History  Problem Relation Age of Onset  . Cancer Mother   . Cancer Sister   . Cancer Sister     Social History   Social History  . Marital Status: Single    Spouse Name: N/A  . Number of Children: N/A  . Years of Education: N/A   Occupational History  . Not on file.   Social History Main Topics  . Smoking status: Never Smoker   . Smokeless tobacco: Never Used  . Alcohol Use: No  . Drug Use: No  . Sexual Activity: Not Currently   Other Topics Concern  . Not on file   Social History Narrative    Allergies  Allergen Reactions  . Cephalexin Diarrhea  . Ciprofloxacin Diarrhea and Other (See Comments)    Internal bleeding  . Flagyl [Metronidazole] Diarrhea  . Gabapentin Other (See Comments)    Dizziness  . Gluten Meal Other (See Comments)    Other Reaction: GI Upset  . Lisinopril Diarrhea  Severe diarrhea  . Naproxen Other (See Comments)    RECTAL BLEEDING  . Other Hives  . Oxycodone Other (See Comments)    Constipation   . Prevacid [Lansoprazole] Diarrhea  . Penicillins Rash  . Pseudoephedrine Rash  . Rogaine  [Minoxidil] Rash  . Sulfa Antibiotics Rash     Review of Systems  Constitutional: Negative for fever, chills, weight loss and malaise/fatigue.  HENT: Negative for drooling, ear discharge, ear pain, sore throat and trouble swallowing.   Eyes: Negative for blurred vision, redness and itching.  Respiratory: Negative for cough, sputum production, shortness of breath, wheezing and stridor.   Cardiovascular: Negative for chest pain, palpitations and leg swelling.   Gastrointestinal: Negative for heartburn, nausea, vomiting, abdominal pain, diarrhea, constipation, blood in stool, melena and bowel incontinence.  Genitourinary: Negative for dysuria, urgency, frequency and hematuria.  Musculoskeletal: Positive for back pain. Negative for myalgias, joint pain and neck pain.  Skin: Positive for itching and rash.  Neurological: Negative for dizziness, tingling, sensory change, focal weakness, loss of consciousness, weakness, numbness and headaches.  Endo/Heme/Allergies: Negative for environmental allergies and polydipsia. Does not bruise/bleed easily.  Psychiatric/Behavioral: Negative for depression and suicidal ideas. The patient is not nervous/anxious and does not have insomnia.      Objective  Filed Vitals:   03/25/16 0907  BP: 124/72  Pulse: 56  Temp: 98.1 F (36.7 C)  TempSrc: Oral  Resp: 16  Height: 4\' 10"  (1.473 m)  Weight: 121 lb (54.885 kg)  SpO2: 97%    Physical Exam  Constitutional: She is well-developed, well-nourished, and in no distress. No distress.  HENT:  Head: Normocephalic and atraumatic.  Right Ear: External ear normal.  Left Ear: External ear normal.  Nose: Nose normal.  Mouth/Throat: Oropharynx is clear and moist.  Eyes: Conjunctivae and EOM are normal. Pupils are equal, round, and reactive to light. Right eye exhibits no discharge. Left eye exhibits no discharge.  Neck: Normal range of motion. Neck supple. No JVD present. No thyromegaly present.  Cardiovascular: Normal rate, regular rhythm, normal heart sounds and intact distal pulses.  Exam reveals no gallop and no friction rub.   No murmur heard. Pulmonary/Chest: Effort normal and breath sounds normal.  Abdominal: Soft. Bowel sounds are normal. She exhibits no mass. There is no tenderness. There is no guarding.  Musculoskeletal: Normal range of motion. She exhibits no edema.  Lymphadenopathy:    She has no cervical adenopathy.  Neurological: She is alert. She has  normal reflexes.  Skin: Skin is warm and dry. She is not diaphoretic.  Psychiatric: Mood and affect normal.  Nursing note and vitals reviewed.     Assessment & Plan  Problem List Items Addressed This Visit    None    Visit Diagnoses    Sciatica, unspecified laterality    -  Primary    tylenol    Multiple allergies        suggest claritin ( children doasage)    Relevant Orders    Ambulatory referral to ENT         Dr. Otilio Miu [facility]  03/25/2016

## 2016-04-08 DIAGNOSIS — L272 Dermatitis due to ingested food: Secondary | ICD-10-CM | POA: Diagnosis not present

## 2016-04-17 DIAGNOSIS — Z170 Estrogen receptor positive status [ER+]: Secondary | ICD-10-CM | POA: Diagnosis not present

## 2016-04-17 DIAGNOSIS — C50812 Malignant neoplasm of overlapping sites of left female breast: Secondary | ICD-10-CM | POA: Diagnosis not present

## 2016-05-05 DIAGNOSIS — Z170 Estrogen receptor positive status [ER+]: Secondary | ICD-10-CM | POA: Diagnosis not present

## 2016-05-05 DIAGNOSIS — I890 Lymphedema, not elsewhere classified: Secondary | ICD-10-CM | POA: Diagnosis not present

## 2016-05-05 DIAGNOSIS — C50819 Malignant neoplasm of overlapping sites of unspecified female breast: Secondary | ICD-10-CM | POA: Diagnosis not present

## 2016-05-05 DIAGNOSIS — M940 Chondrocostal junction syndrome [Tietze]: Secondary | ICD-10-CM | POA: Diagnosis not present

## 2016-05-05 DIAGNOSIS — C50812 Malignant neoplasm of overlapping sites of left female breast: Secondary | ICD-10-CM | POA: Diagnosis not present

## 2016-05-05 DIAGNOSIS — B373 Candidiasis of vulva and vagina: Secondary | ICD-10-CM | POA: Diagnosis not present

## 2016-05-14 DIAGNOSIS — M6281 Muscle weakness (generalized): Secondary | ICD-10-CM | POA: Diagnosis not present

## 2016-05-14 DIAGNOSIS — M6289 Other specified disorders of muscle: Secondary | ICD-10-CM | POA: Diagnosis not present

## 2016-05-18 DIAGNOSIS — M6289 Other specified disorders of muscle: Secondary | ICD-10-CM | POA: Diagnosis not present

## 2016-05-18 DIAGNOSIS — M6281 Muscle weakness (generalized): Secondary | ICD-10-CM | POA: Diagnosis not present

## 2016-05-20 DIAGNOSIS — M6281 Muscle weakness (generalized): Secondary | ICD-10-CM | POA: Diagnosis not present

## 2016-05-20 DIAGNOSIS — M6289 Other specified disorders of muscle: Secondary | ICD-10-CM | POA: Diagnosis not present

## 2016-05-27 DIAGNOSIS — L570 Actinic keratosis: Secondary | ICD-10-CM | POA: Diagnosis not present

## 2016-05-27 DIAGNOSIS — M6281 Muscle weakness (generalized): Secondary | ICD-10-CM | POA: Diagnosis not present

## 2016-05-27 DIAGNOSIS — M6289 Other specified disorders of muscle: Secondary | ICD-10-CM | POA: Diagnosis not present

## 2016-05-29 DIAGNOSIS — M6281 Muscle weakness (generalized): Secondary | ICD-10-CM | POA: Diagnosis not present

## 2016-05-29 DIAGNOSIS — M6289 Other specified disorders of muscle: Secondary | ICD-10-CM | POA: Diagnosis not present

## 2016-06-02 DIAGNOSIS — M6281 Muscle weakness (generalized): Secondary | ICD-10-CM | POA: Diagnosis not present

## 2016-06-02 DIAGNOSIS — M6289 Other specified disorders of muscle: Secondary | ICD-10-CM | POA: Diagnosis not present

## 2016-06-12 DIAGNOSIS — M9905 Segmental and somatic dysfunction of pelvic region: Secondary | ICD-10-CM | POA: Diagnosis not present

## 2016-06-12 DIAGNOSIS — M545 Low back pain: Secondary | ICD-10-CM | POA: Diagnosis not present

## 2016-06-12 DIAGNOSIS — M729 Fibroblastic disorder, unspecified: Secondary | ICD-10-CM | POA: Diagnosis not present

## 2016-06-12 DIAGNOSIS — M9903 Segmental and somatic dysfunction of lumbar region: Secondary | ICD-10-CM | POA: Diagnosis not present

## 2016-06-16 DIAGNOSIS — M6281 Muscle weakness (generalized): Secondary | ICD-10-CM | POA: Diagnosis not present

## 2016-06-16 DIAGNOSIS — M6289 Other specified disorders of muscle: Secondary | ICD-10-CM | POA: Diagnosis not present

## 2016-06-19 DIAGNOSIS — M6281 Muscle weakness (generalized): Secondary | ICD-10-CM | POA: Diagnosis not present

## 2016-06-19 DIAGNOSIS — M6289 Other specified disorders of muscle: Secondary | ICD-10-CM | POA: Diagnosis not present

## 2016-06-22 DIAGNOSIS — M6281 Muscle weakness (generalized): Secondary | ICD-10-CM | POA: Diagnosis not present

## 2016-06-22 DIAGNOSIS — L570 Actinic keratosis: Secondary | ICD-10-CM | POA: Diagnosis not present

## 2016-06-22 DIAGNOSIS — M6289 Other specified disorders of muscle: Secondary | ICD-10-CM | POA: Diagnosis not present

## 2016-06-25 DIAGNOSIS — M6289 Other specified disorders of muscle: Secondary | ICD-10-CM | POA: Diagnosis not present

## 2016-06-25 DIAGNOSIS — M6281 Muscle weakness (generalized): Secondary | ICD-10-CM | POA: Diagnosis not present

## 2016-07-07 DIAGNOSIS — M6281 Muscle weakness (generalized): Secondary | ICD-10-CM | POA: Diagnosis not present

## 2016-07-07 DIAGNOSIS — M6289 Other specified disorders of muscle: Secondary | ICD-10-CM | POA: Diagnosis not present

## 2016-07-09 ENCOUNTER — Encounter: Payer: Self-pay | Admitting: Emergency Medicine

## 2016-07-09 ENCOUNTER — Emergency Department: Payer: Medicare Other

## 2016-07-09 ENCOUNTER — Emergency Department
Admission: EM | Admit: 2016-07-09 | Discharge: 2016-07-09 | Disposition: A | Discharge status: Home / Self Care | Payer: Medicare Other | Attending: Emergency Medicine | Admitting: Emergency Medicine

## 2016-07-09 DIAGNOSIS — Y929 Unspecified place or not applicable: Secondary | ICD-10-CM | POA: Diagnosis not present

## 2016-07-09 DIAGNOSIS — Z79899 Other long term (current) drug therapy: Secondary | ICD-10-CM | POA: Diagnosis not present

## 2016-07-09 DIAGNOSIS — S6991XA Unspecified injury of right wrist, hand and finger(s), initial encounter: Secondary | ICD-10-CM | POA: Diagnosis not present

## 2016-07-09 DIAGNOSIS — W19XXXA Unspecified fall, initial encounter: Secondary | ICD-10-CM

## 2016-07-09 DIAGNOSIS — Z853 Personal history of malignant neoplasm of breast: Secondary | ICD-10-CM | POA: Insufficient documentation

## 2016-07-09 DIAGNOSIS — I10 Essential (primary) hypertension: Secondary | ICD-10-CM | POA: Insufficient documentation

## 2016-07-09 DIAGNOSIS — R001 Bradycardia, unspecified: Secondary | ICD-10-CM | POA: Insufficient documentation

## 2016-07-09 DIAGNOSIS — W1800XA Striking against unspecified object with subsequent fall, initial encounter: Secondary | ICD-10-CM | POA: Diagnosis not present

## 2016-07-09 DIAGNOSIS — Y9301 Activity, walking, marching and hiking: Secondary | ICD-10-CM | POA: Diagnosis not present

## 2016-07-09 DIAGNOSIS — S41112A Laceration without foreign body of left upper arm, initial encounter: Secondary | ICD-10-CM

## 2016-07-09 DIAGNOSIS — S61412A Laceration without foreign body of left hand, initial encounter: Secondary | ICD-10-CM | POA: Insufficient documentation

## 2016-07-09 DIAGNOSIS — S0081XA Abrasion of other part of head, initial encounter: Secondary | ICD-10-CM | POA: Diagnosis not present

## 2016-07-09 DIAGNOSIS — S0990XA Unspecified injury of head, initial encounter: Secondary | ICD-10-CM | POA: Diagnosis not present

## 2016-07-09 DIAGNOSIS — M79642 Pain in left hand: Secondary | ICD-10-CM | POA: Diagnosis not present

## 2016-07-09 DIAGNOSIS — W1830XA Fall on same level, unspecified, initial encounter: Secondary | ICD-10-CM | POA: Diagnosis not present

## 2016-07-09 DIAGNOSIS — Z23 Encounter for immunization: Secondary | ICD-10-CM | POA: Diagnosis not present

## 2016-07-09 DIAGNOSIS — S199XXA Unspecified injury of neck, initial encounter: Secondary | ICD-10-CM | POA: Diagnosis not present

## 2016-07-09 DIAGNOSIS — S51812A Laceration without foreign body of left forearm, initial encounter: Secondary | ICD-10-CM | POA: Insufficient documentation

## 2016-07-09 DIAGNOSIS — Y999 Unspecified external cause status: Secondary | ICD-10-CM | POA: Diagnosis not present

## 2016-07-09 DIAGNOSIS — S0181XA Laceration without foreign body of other part of head, initial encounter: Secondary | ICD-10-CM | POA: Diagnosis not present

## 2016-07-09 HISTORY — DX: Sciatica, unspecified side: M54.30

## 2016-07-09 LAB — URINALYSIS COMPLETE WITH MICROSCOPIC (ARMC ONLY)
BILIRUBIN URINE: NEGATIVE
Bacteria, UA: NONE SEEN
GLUCOSE, UA: NEGATIVE mg/dL
Hgb urine dipstick: NEGATIVE
KETONES UR: NEGATIVE mg/dL
Nitrite: NEGATIVE
PROTEIN: NEGATIVE mg/dL
SQUAMOUS EPITHELIAL / LPF: NONE SEEN
Specific Gravity, Urine: 1.003 — ABNORMAL LOW (ref 1.005–1.030)
pH: 9 — ABNORMAL HIGH (ref 5.0–8.0)

## 2016-07-09 MED ORDER — ACETAMINOPHEN 325 MG PO TABS: 650.0000 mg | ORAL_TABLET | Freq: Once | ORAL | Status: DC

## 2016-07-09 MED ORDER — OXYCODONE-ACETAMINOPHEN 5-325 MG PO TABS: 1.0000 | ORAL_TABLET | Freq: Three times a day (TID) | ORAL | 0 refills | Status: DC | PRN

## 2016-07-09 MED ORDER — OXYCODONE-ACETAMINOPHEN 5-325 MG PO TABS
1.0000 | ORAL_TABLET | Freq: Once | ORAL | Status: AC
  Administered 2016-07-09: 1 via ORAL
  Filled 2016-07-09: qty 1

## 2016-07-09 MED ORDER — DOXYCYCLINE HYCLATE 50 MG PO CAPS: 100.0000 mg | ORAL_CAPSULE | Freq: Two times a day (BID) | ORAL | 0 refills | Status: AC

## 2016-07-09 MED ORDER — LIDOCAINE-EPINEPHRINE 2 %-1:100000 IJ SOLN
30.0000 mL | Freq: Once | INTRAMUSCULAR | Status: AC
Start: 2016-07-09 — End: 2016-07-09
  Administered 2016-07-09: 30 mL via INTRADERMAL
  Filled 2016-07-09: qty 30

## 2016-07-09 MED ORDER — TETANUS-DIPHTH-ACELL PERTUSSIS 5-2.5-18.5 LF-MCG/0.5 IM SUSP
0.5000 mL | Freq: Once | INTRAMUSCULAR | Status: AC
  Administered 2016-07-09: 0.5 mL via INTRAMUSCULAR
  Filled 2016-07-09: qty 0.5

## 2016-07-09 MED ORDER — LIDOCAINE-EPINEPHRINE-TETRACAINE (LET) SOLUTION
3.0000 mL | Freq: Once | NASAL | Status: AC
  Administered 2016-07-09: 3 mL via TOPICAL
  Filled 2016-07-09: qty 6

## 2016-07-09 MED ORDER — LIDOCAINE-EPINEPHRINE (PF) 1 %-1:200000 IJ SOLN
INTRAMUSCULAR | Status: AC
  Administered 2016-07-09: 18:00:00
  Filled 2016-07-09: qty 30

## 2016-07-09 MED ORDER — TETANUS-DIPHTH-ACELL PERTUSSIS 5-2.5-18.5 LF-MCG/0.5 IM SUSP: 0.5000 mL | Freq: Once | INTRAMUSCULAR | Status: DC

## 2016-07-09 MED ORDER — BACITRACIN ZINC 500 UNIT/GM EX OINT
TOPICAL_OINTMENT | CUTANEOUS | Status: AC
  Filled 2016-07-09: qty 1.8

## 2016-07-09 MED ORDER — LIDOCAINE-EPINEPHRINE-TETRACAINE (LET) SOLUTION
NASAL | Status: AC
  Administered 2016-07-09: 3 mL via TOPICAL
  Filled 2016-07-09: qty 9

## 2016-07-09 NOTE — ED Notes (Signed)
ED Provider at bedside. 

## 2016-07-09 NOTE — ED Notes (Signed)
Pt sutures dressed with bacitracin and xeroform under curlex.

## 2016-07-09 NOTE — Discharge Instructions (Addendum)
Please do not soak the arm that has sutures in it. You may use Neosporin, or any triple antibiotic cream, and a thick coat 3 times daily over any abrasions and urinary laceration until they're completely healed.  Please make an appointment with your primary care physician to have your sutures removed in 7-10 days. Today her heart rate was in the high 50s; this may be normal for you but please let your doctor now and have them check it again. Please make an appointment in 1 day for a wound check.  Take the entire course of antibiotics, even if you're feeling better.  Return to the emergency department if you develop severe pain, nausea or vomiting, rash, fever, or any other symptoms concerning to you.

## 2016-07-09 NOTE — ED Triage Notes (Signed)
Pt to ED via EMS today after having mechanical fall (pt reports she lost her balance while stooping to pet a cat).  Denies loss of consciousness.  Pt alert and oriented on ED arrival.

## 2016-07-09 NOTE — ED Provider Notes (Signed)
[facility] Emergency Department Provider Note  ____________________________________________  Time seen: Approximately 3:58 PM  I have reviewed the triage vital signs and the nursing notes.   HISTORY  Chief Complaint Fall    HPI Tammy Mcdaniel is a 82 y.o. female with a remote history of breast cancer, not currently on any medications, presenting with multiple injuries after a fall. The patient lives independently and had just returned from a one-mile walk. She bent down to pet her neighbor's cat, and lost her balance, falling onto the ground, striking her head on the left forehead, and both her hands, resulting in multiple abrasions and a large skin tear over the dorsum of the left hand and forearm. She did not lose consciousness. She did not have any preceding lightheadedness, shortness of breath, chest pain.  After the fall, she felt slightly lightheaded when she looked at all the blood coming from her skin tear. The patient was able to get up after the fall, wash her pants, and then seek medical attention. At this time, the patient feels back to her normal self, and her daughter confirms that she is back at her baseline mental status. The patient has not had any recent illness, nor has she had any new medications.   Past Medical History:  Diagnosis Date  . Cancer ([place])   . Colitis, ulcerative ([place])   . Diverticulitis   . Hypertension    Diet controlled  . Kidney stone   . Rosacea   . Sciatica     There are no active problems to display for this patient.   Past Surgical History:  Procedure Laterality Date  . ABDOMINAL HYSTERECTOMY    . BREAST SURGERY Left   . EYE SURGERY     Lens implant  . MASTECTOMY     Left and Right    Current Outpatient Rx  . Order #: [id number] Class: Historical Med  . Order #: [id number] Class: Historical Med  . Order #: [id number] Class: Print  . Order #: [id number] Class: Print    Allergies Cephalexin; Ciprofloxacin; Flagyl  [metronidazole]; Gabapentin; Gluten meal; Lisinopril; Naproxen; Other; Oxycodone; Prevacid [lansoprazole]; Penicillins; Pseudoephedrine; Rogaine  [minoxidil]; Sulfa antibiotics; and Tramadol  Family History  Problem Relation Age of Onset  . Cancer Mother   . Cancer Sister   . Cancer Sister     Social History Social History  Substance Use Topics  . Smoking status: Never Smoker  . Smokeless tobacco: Never Used  . Alcohol use No    Review of Systems Constitutional: No fever/chills.Positive fall. No loss of consciousness. No lightheadedness or syncope prior to the fall. Positive lightheadedness after the fall when looking at blood on her hand. Eyes: No visual changes. No blurred or double vision. ENT: No sore throat. No congestion or rhinorrhea. Cardiovascular: Denies chest pain. Denies palpitations. Respiratory: Denies shortness of breath.  No cough. Gastrointestinal: No abdominal pain.  No nausea, no vomiting.  No diarrhea.  No constipation. Genitourinary: Negative for dysuria. Musculoskeletal: Negative for back pain. No neck pain. No pain in the hips or other joints, except over the MIP of the right middle finger. Skin: Negative for rash. Positive for multiple abrasions and lacerations. Neurological: Negative for headaches. No focal numbness, tingling or weakness. No difficulty walking.  10-point ROS otherwise negative.  ____________________________________________   PHYSICAL EXAM:  VITAL SIGNS: ED Triage Vitals  Enc Vitals Group     BP 07/09/16 1333 (!) 195/87     Pulse Rate 07/09/16 1333 79  Resp 07/09/16 1333 18     Temp --      Temp src --      SpO2 07/09/16 1333 98 %     Weight 07/09/16 1334 119 lb (54 kg)     Height 07/09/16 1334 4\' 10"  (1.473 m)     Head Circumference --      Peak Flow --      Pain Score 07/09/16 1334 9     Pain Loc --      Pain Edu? --      Excl. in [place]? --     Constitutional: Alert and oriented. Well appearing and in no acute distress.  Answers questions appropriately.GCS is 15. Eyes: Conjunctivae are normal.  EOMI. PERRLA. No scleral icterus. No raccoon eyes. Head: Atraumatic. No Battle sign. No midface instability. Nose: No congestion/rhinnorhea. No swelling over the nose. No septal hematoma. Mouth/Throat: Mucous membranes are moist. No dental injury or malocclusion. Neck: No stridor.  Supple.  No midline C-spine tenderness to palpation, step-offs or deformities. Cardiovascular: Normal rate, regular rhythm. No murmurs, rubs or gallops.  Respiratory: Normal respiratory effort.  No accessory muscle use or retractions. Lungs CTAB.  No wheezes, rales or ronchi. Gastrointestinal: Soft, nontender and nondistended.  No guarding or rebound.  No peritoneal signs. Musculoskeletal: No thoracic or lumbar spine tenderness to palpation in the midline, step-offs or deformities. No LE edema. No ttp in the calves or palpable cords.  Negative Homan's sign.  The right hand has superficial abrasion on the medial aspect of the middle digit over the MIP with some ecchymosis and swelling. The patient has 5 out of 5 grip strength and normal flexion and extension in all the joints of the right middle finger. Normal right radial pulse. Normal sensation to light touch in the right hand. Over the left hand, the patient has a large skin abrasion that extends from the knuckles to the distal one third of the forearm with exposure of the middle extensor tendon but no evidence of injury of any tendons. The patient has full flexion and extension in all 5 digits and all the joints. Normal left radial pulse. Normal sensation to light touch. Cap refill is less than 2 seconds. The patient has full range of motion of the left wrist, elbow and shoulder without pain. The patient also has full range of motion without pain in the right wrist, elbow and shoulder, bilateral hips, knees, ankles. The pelvis is stable. Neurologic:  A&Ox3.  Speech is clear.  Face and smile are  symmetric.  EOMI.  PERRLA. Moves all extremities well. Normal gait without ataxia. Skin:  Skin is warm, dry.   ____________________________________________   LABS (all labs ordered are listed, but only abnormal results are displayed)  Labs Reviewed  URINALYSIS COMPLETEWITH MICROSCOPIC ([place]) - Abnormal; Notable for the following:       Result Value   Color, Urine STRAW (*)    APPearance CLEAR (*)    Specific Gravity, Urine 1.003 (*)    pH 9.0 (*)    Leukocytes, UA TRACE (*)    All other components within normal limits   ____________________________________________  EKG  ED ECG REPORT I, Eula Listen, the attending physician, personally viewed and interpreted this ECG.   Date: 07/09/2016  EKG Time: 1610  Rate: 57  Rhythm: sinus bradycardia  Axis: normal  Intervals:none  ST&T Change: Nonspecific T-wave inversion in V1 and V2. No ST elevation.  ____________________________________________  RADIOLOGY  Ct Head Wo Contrast  Result  Date: 07/09/2016 CLINICAL DATA:  Fall, hit her head EXAM: CT HEAD WITHOUT CONTRAST CT CERVICAL SPINE WITHOUT CONTRAST TECHNIQUE: Multidetector CT imaging of the head and cervical spine was performed following the standard protocol without intravenous contrast. Multiplanar CT image reconstructions of the cervical spine were also generated. COMPARISON:  05/07/2011 FINDINGS: CT HEAD FINDINGS Brain: No intracranial hemorrhage, mass effect or midline shift. No acute cortical infarction. Stable calcified meningioma left parietal region abutting the skull measures 8 mm. No surrounding mass effect. Stable cerebral atrophy. Stable periventricular chronic white matter disease. Vascular: Atherosclerotic calcifications of vertebral arteries. Skull: No skull fracture is noted. Sinuses/Orbits: Paranasal sinuses and mastoid air cells are unremarkable. Other: None CT CERVICAL SPINE FINDINGS Alignment: There is normal alignment. Skull base and vertebrae: No  acute fracture or subluxation. There are degenerative changes C1-C2 articulation. Mild anterior and mild posterior spurring at C3-C4-C4-C5 C5-C6 and C6-C7 level. There is mild to moderate anterior spurring at C7-T1 level. Mild anterior spurring at T1-T2 level. Soft tissues and spinal canal: No prevertebral soft tissue swelling. Cervical airway is patent. Disc levels: Disc space flattening with mild anterior mild posterior spurring at C3-C4-C4-C5 C5-C6 level. Mild disc space flattening at C6-C7 level. Upper chest: The visualized lung apices shows no evidence of pneumothorax. Other: None IMPRESSION: 1. No acute intracranial abnormality. Stable atrophy and chronic white matter disease. No definite acute cortical infarction. 2. No cervical spine acute fracture or subluxation. Multilevel degenerative changes as described above. Electronically Signed   By: Lahoma Crocker M.D.   On: 07/09/2016 15:00   Ct Cervical Spine Wo Contrast  Result Date: 07/09/2016 CLINICAL DATA:  Fall, hit her head EXAM: CT HEAD WITHOUT CONTRAST CT CERVICAL SPINE WITHOUT CONTRAST TECHNIQUE: Multidetector CT imaging of the head and cervical spine was performed following the standard protocol without intravenous contrast. Multiplanar CT image reconstructions of the cervical spine were also generated. COMPARISON:  05/07/2011 FINDINGS: CT HEAD FINDINGS Brain: No intracranial hemorrhage, mass effect or midline shift. No acute cortical infarction. Stable calcified meningioma left parietal region abutting the skull measures 8 mm. No surrounding mass effect. Stable cerebral atrophy. Stable periventricular chronic white matter disease. Vascular: Atherosclerotic calcifications of vertebral arteries. Skull: No skull fracture is noted. Sinuses/Orbits: Paranasal sinuses and mastoid air cells are unremarkable. Other: None CT CERVICAL SPINE FINDINGS Alignment: There is normal alignment. Skull base and vertebrae: No acute fracture or subluxation. There are  degenerative changes C1-C2 articulation. Mild anterior and mild posterior spurring at C3-C4-C4-C5 C5-C6 and C6-C7 level. There is mild to moderate anterior spurring at C7-T1 level. Mild anterior spurring at T1-T2 level. Soft tissues and spinal canal: No prevertebral soft tissue swelling. Cervical airway is patent. Disc levels: Disc space flattening with mild anterior mild posterior spurring at C3-C4-C4-C5 C5-C6 level. Mild disc space flattening at C6-C7 level. Upper chest: The visualized lung apices shows no evidence of pneumothorax. Other: None IMPRESSION: 1. No acute intracranial abnormality. Stable atrophy and chronic white matter disease. No definite acute cortical infarction. 2. No cervical spine acute fracture or subluxation. Multilevel degenerative changes as described above. Electronically Signed   By: Lahoma Crocker M.D.   On: 07/09/2016 15:00   Dg Hand Complete Left  Result Date: 07/09/2016 CLINICAL DATA:  [place] with a laceration along the posterior aspect of the left hand. EXAM: LEFT HAND - COMPLETE 3+ VIEW COMPARISON:  None. FINDINGS: Diffuse osteopenia in the hand and wrist. Negative for acute fracture or dislocation involving the left hand. Mild irregularity of the fifth metacarpal bone is  suggestive for an old injury. Joint space narrowing at the first carpometacarpal joint. Mild joint space narrowing at the DIP joints is suggestive for osteoarthritis. There is a bandage along the dorsal aspect of the hand. IMPRESSION: No acute bone abnormality in left hand. Electronically Signed   By: Markus Daft M.D.   On: 07/09/2016 14:41   Dg Finger Middle Right  Result Date: 07/09/2016 CLINICAL DATA:  Skin tear, fall EXAM: RIGHT MIDDLE FINGER 2+V COMPARISON:  RIGHT hand radiographs 04/27/2015 FINDINGS: Diffuse osseous demineralization. Degenerative changes at DIP joint with question mild soft tissue swelling at DIP joint. Mild obliquity at the distal phalanx and DIP joint of the RIGHT middle finger. Dressing  artifacts at RIGHT middle finger. No definite acute fracture, dislocation, or bone destruction. IMPRESSION: Degenerative changes at DIP joint of the RIGHT middle finger. No definite acute bony abnormalities 3 Electronically Signed   By: Lavonia Dana M.D.   On: 07/09/2016 14:41    ____________________________________________   PROCEDURES  Procedure(s) performed: None  Procedures  Critical Care performed: No ____________________________________________   INITIAL IMPRESSION / ASSESSMENT AND PLAN / ED COURSE  Pertinent labs & imaging results that were available during my care of the patient were reviewed by me and considered in my medical decision making (see chart for details).  82 y.o. female with multiple injuries after fall. The patient's clinical history is not suggestive of a syncopal episode, nor an acute stroke or cardiac cause for her fall. The patient does not have evidence of a UTI. The patient has no evidence of acute intracranial or cervical spine injury on her CT scans. Her plain x-rays are negative for fracture to the right middle finger, left hand and wrist.  The patient's left hand and forearm skin tear is extensive, and I will try to treat her pain with light in order to be able to soak her hand in saline and Betadine. Initially, she did not tolerate this well so I will inject her with some lidocaine and an attempt to be able to wash her wound. Afterwards, will take the flap of skin and suture it as a protectant for her underlying wound. I did discuss that this flap of skin is unlikely to be vascular enough to survive in the long-term. I will place her on prophylactic doxycycline as she has allergies to both Bactrim and flex. She understands that she will need a wound check tomorrow, and suture removal in 7-10 days.  LACERATION REPAIR Performed by: Eula Listen Authorized by: Eula Listen Consent: Verbal consent obtained. Risks and benefits: risks, benefits  and alternatives were discussed Consent given by: patient Patient identity confirmed: provided demographic data Prepped and Draped in normal sterile fashion Wound explored  Laceration Location: Left Forearm and Dorsum of hand  Laceration Length: 30cm  No Foreign Bodies seen or palpated  Anesthesia: LET topically and local infiltration  Local anesthetic: lidocaine 1% with epinephrine  Anesthetic total: 6 ml  Irrigation method: syringe Amount of cleaning: standard  Skin closure: 6-0 Prolene  Number of sutures: 25  Technique: simple interrupted  Patient tolerance: Patient tolerated the procedure well with no immediate complications.  Neosporin was applied to the wounds, and the wounds were dressed with sterile gauze. The patient tolerated the procedure well per  ____________________________________________  FINAL CLINICAL IMPRESSION(S) / ED DIAGNOSES  Final diagnoses:  Skin tear of left upper arm without complication, initial encounter  Abrasion of forehead, initial encounter  Fall, initial encounter  Sinus bradycardia    Clinical  Course      NEW MEDICATIONS STARTED DURING THIS VISIT:  New Prescriptions   DOXYCYCLINE (VIBRAMYCIN) 50 MG CAPSULE    Take 2 capsules (100 mg total) by mouth 2 (two) times daily.   OXYCODONE-ACETAMINOPHEN (ROXICET) 5-325 MG TABLET    Take 1 tablet by mouth every 8 (eight) hours as needed.      Eula Listen, MD 07/09/16 [phone]

## 2016-07-10 ENCOUNTER — Encounter: Payer: Self-pay | Admitting: Emergency Medicine

## 2016-07-10 ENCOUNTER — Ambulatory Visit
Admission: EM | Admit: 2016-07-10 | Discharge: 2016-07-10 | Disposition: A | Discharge status: Home / Self Care | Payer: Medicare Other | Attending: Family Medicine | Admitting: Family Medicine

## 2016-07-10 DIAGNOSIS — Z5189 Encounter for other specified aftercare: Secondary | ICD-10-CM

## 2016-07-10 DIAGNOSIS — Z4800 Encounter for change or removal of nonsurgical wound dressing: Secondary | ICD-10-CM

## 2016-07-10 NOTE — ED Provider Notes (Signed)
[facility]    CSN: [id number] Arrival date & time: 07/10/16  0903     History   Chief Complaint Chief Complaint  Patient presents with  . Wound Check    HPI Leia Tofte is a 82 y.o. female.   82 yo female seen yesterday at [facility] ED after a fall. Had skin tear on left hand/forearm repaired with sutures. Patient states she's here for a recheck. No new concerns or problems.    The history is provided by the patient.    Past Medical History:  Diagnosis Date  . Cancer ([place])   . Colitis, ulcerative ([place])   . Diverticulitis   . Hypertension    Diet controlled  . Kidney stone   . Rosacea   . Sciatica     There are no active problems to display for this patient.   Past Surgical History:  Procedure Laterality Date  . ABDOMINAL HYSTERECTOMY    . BREAST SURGERY Left   . EYE SURGERY     Lens implant  . MASTECTOMY     Left and Right    OB History    No data available       Home Medications    Prior to Admission medications   Medication Sig Start Date End Date Taking? Authorizing Provider  calcium carbonate (OS-CAL) 600 MG TABS tablet Take 600 mg by mouth 3 (three) times daily with meals.    Historical Provider, MD  doxycycline (VIBRAMYCIN) 50 MG capsule Take 2 capsules (100 mg total) by mouth 2 (two) times daily. 07/09/16 07/16/16  Eula Listen, MD  Multiple Vitamins-Minerals (PRESERVISION AREDS 2 PO) Take 1 tablet by mouth 2 (two) times daily.    Historical Provider, MD  oxyCODONE-acetaminophen (ROXICET) 5-325 MG tablet Take 1 tablet by mouth every 8 (eight) hours as needed. 07/09/16 07/09/17  Eula Listen, MD    Family History Family History  Problem Relation Age of Onset  . Cancer Mother   . Cancer Sister   . Cancer Sister     Social History Social History  Substance Use Topics  . Smoking status: Never Smoker  . Smokeless tobacco: Never Used  . Alcohol use No     Allergies   Cephalexin; Ciprofloxacin; Flagyl  [metronidazole]; Gabapentin; Gluten meal; Lisinopril; Naproxen; Other; Oxycodone; Prevacid [lansoprazole]; Penicillins; Pseudoephedrine; Rogaine  [minoxidil]; Sulfa antibiotics; and Tramadol   Review of Systems Review of Systems   Physical Exam Triage Vital Signs ED Triage Vitals  Enc Vitals Group     BP 07/10/16 0942 (!) 147/73     Pulse Rate 07/10/16 0942 63     Resp 07/10/16 0942 18     Temp 07/10/16 0942 98.4 F (36.9 C)     Temp Source 07/10/16 0942 Oral     SpO2 07/10/16 0942 100 %     Weight 07/10/16 0943 119 lb (54 kg)     Height 07/10/16 0943 4\' 10"  (1.473 m)     Head Circumference --      Peak Flow --      Pain Score --      Pain Loc --      Pain Edu? --      Excl. in [place]? --    No data found.   Updated Vital Signs BP (!) 147/73 (BP Location: Right Arm)   Pulse 63   Temp 98.4 F (36.9 C) (Oral)   Resp 18   Ht 4\' 10"  (1.473 m)   Wt 119 lb (54 kg)  SpO2 100%   BMI 24.87 kg/m   Visual Acuity Right Eye Distance:   Left Eye Distance:   Bilateral Distance:    Right Eye Near:   Left Eye Near:    Bilateral Near:     Physical Exam  Constitutional: She appears well-developed and well-nourished. No distress.  Skin: She is not diaphoretic.  Left arm/hand wound with sutures in place; mild serosanguinous drainage  Nursing note and vitals reviewed.    UC Treatments / Results  Labs (all labs ordered are listed, but only abnormal results are displayed) Labs Reviewed - No data to display  EKG  EKG Interpretation None       Radiology No results found.  Procedures Procedures (including critical care time)  Medications Ordered in UC Medications - No data to display   Initial Impression / Assessment and Plan / UC Course  I have reviewed the triage vital signs and the nursing notes.  Pertinent labs & imaging results that were available during my care of the patient were reviewed by me and considered in my medical decision making (see chart for  details).  Clinical Course       Final Clinical Impressions(s) / UC Diagnoses   Final diagnoses:  Visit for wound check    New Prescriptions Discharge Medication List as of 07/10/2016 10:09 AM     1.diagnosis reviewed with patient; patient reassured; wound dressed; recommend continue current wound care and managment 2. Follow up for suture removal as recommended or sooner prn if any problems   Norval Gable, MD 07/12/16 1630

## 2016-07-10 NOTE — ED Triage Notes (Signed)
Pt reports fell yesterday and seen at [facility] had sutures placed to left hand. Pt reports referred here today for wound check

## 2016-07-13 DIAGNOSIS — M9905 Segmental and somatic dysfunction of pelvic region: Secondary | ICD-10-CM | POA: Diagnosis not present

## 2016-07-13 DIAGNOSIS — M9903 Segmental and somatic dysfunction of lumbar region: Secondary | ICD-10-CM | POA: Diagnosis not present

## 2016-07-13 DIAGNOSIS — M545 Low back pain: Secondary | ICD-10-CM | POA: Diagnosis not present

## 2016-07-13 DIAGNOSIS — M729 Fibroblastic disorder, unspecified: Secondary | ICD-10-CM | POA: Diagnosis not present

## 2016-07-14 ENCOUNTER — Telehealth: Payer: Self-pay

## 2016-07-14 NOTE — Telephone Encounter (Signed)
Courtesy call back completed today after patients visit at [facility]. Patient improving and will follow up in 7-10 days for suture removal.

## 2016-07-17 ENCOUNTER — Ambulatory Visit
Admission: EM | Admit: 2016-07-17 | Discharge: 2016-07-17 | Disposition: A | Discharge status: Home / Self Care | Payer: Medicare Other | Attending: Family Medicine | Admitting: Family Medicine

## 2016-07-17 DIAGNOSIS — Z5189 Encounter for other specified aftercare: Secondary | ICD-10-CM

## 2016-07-17 DIAGNOSIS — Z4800 Encounter for change or removal of nonsurgical wound dressing: Secondary | ICD-10-CM

## 2016-07-17 MED ORDER — DOXYCYCLINE MONOHYDRATE 100 MG PO CAPS: 100.0000 mg | ORAL_CAPSULE | Freq: Two times a day (BID) | ORAL | 0 refills | Status: DC

## 2016-07-17 NOTE — ED Triage Notes (Signed)
Patient is here today for recheck of wound and possible suture removal. Patient states that pain has got worse and area more red. Patient states that pain has been worse at night.

## 2016-07-17 NOTE — ED Provider Notes (Signed)
[facility]    CSN: [id number] Arrival date & time: 07/17/16  [id number]     History   Chief Complaint Chief Complaint  Patient presents with  . Wound Check    HPI Tammy Mcdaniel is a 82 y.o. female.   82 yo female with a h/o fall and left arm skin tear with suture repair in ED, here for wound check and suture removal. States last night area started hurting a little. Denies any fevers, chills.     Wound Check     Past Medical History:  Diagnosis Date  . Cancer ([place])   . Colitis, ulcerative ([place])   . Diverticulitis   . Hypertension    Diet controlled  . Kidney stone   . Rosacea   . Sciatica     There are no active problems to display for this patient.   Past Surgical History:  Procedure Laterality Date  . ABDOMINAL HYSTERECTOMY    . BREAST SURGERY Left   . EYE SURGERY     Lens implant  . MASTECTOMY     Left and Right    OB History    No data available       Home Medications    Prior to Admission medications   Medication Sig Start Date End Date Taking? Authorizing Provider  calcium carbonate (OS-CAL) 600 MG TABS tablet Take 600 mg by mouth 3 (three) times daily with meals.   Yes Historical Provider, MD  Multiple Vitamins-Minerals (PRESERVISION AREDS 2 PO) Take 1 tablet by mouth 2 (two) times daily.   Yes Historical Provider, MD  oxyCODONE-acetaminophen (ROXICET) 5-325 MG tablet Take 1 tablet by mouth every 8 (eight) hours as needed. 07/09/16 07/09/17 Yes Anne-Caroline Mariea Clonts, MD  doxycycline (MONODOX) 100 MG capsule Take 1 capsule (100 mg total) by mouth 2 (two) times daily. 07/17/16   Norval Gable, MD    Family History Family History  Problem Relation Age of Onset  . Cancer Mother   . Cancer Sister   . Cancer Sister     Social History Social History  Substance Use Topics  . Smoking status: Never Smoker  . Smokeless tobacco: Never Used  . Alcohol use No     Allergies   Cephalexin; Ciprofloxacin; Flagyl [metronidazole];  Gabapentin; Gluten meal; Lisinopril; Naproxen; Other; Oxycodone; Prevacid [lansoprazole]; Penicillins; Pseudoephedrine; Rogaine  [minoxidil]; Sulfa antibiotics; and Tramadol   Review of Systems Review of Systems   Physical Exam Triage Vital Signs ED Triage Vitals  Enc Vitals Group     BP 07/17/16 1047 (!) 193/71     Pulse Rate 07/17/16 1047 (!) 56     Resp 07/17/16 1047 16     Temp 07/17/16 1047 97.6 F (36.4 C)     Temp Source 07/17/16 1047 Oral     SpO2 07/17/16 1047 97 %     Weight 07/17/16 1046 119 lb (54 kg)     Height 07/17/16 1046 4\' 10"  (1.473 m)     Head Circumference --      Peak Flow --      Pain Score 07/17/16 1047 6     Pain Loc --      Pain Edu? --      Excl. in [place]? --    No data found.   Updated Vital Signs BP (!) 193/71 (BP Location: Left Arm)   Pulse (!) 56   Temp 97.6 F (36.4 C) (Oral)   Resp 16   Ht 4\' 10"  (1.473 m)   Wt  119 lb (54 kg)   SpO2 97%   BMI 24.87 kg/m   Visual Acuity Right Eye Distance:   Left Eye Distance:   Bilateral Distance:    Right Eye Near:   Left Eye Near:    Bilateral Near:     Physical Exam  Constitutional: She appears well-developed and well-nourished. No distress.  Skin: She is not diaphoretic.  Left hand skin wound healed, sutures in place; no drainage; mild erythema noted  Nursing note and vitals reviewed.    UC Treatments / Results  Labs (all labs ordered are listed, but only abnormal results are displayed) Labs Reviewed - No data to display  EKG  EKG Interpretation None       Radiology No results found.  Procedures Procedures (including critical care time)  Medications Ordered in UC Medications - No data to display   Initial Impression / Assessment and Plan / UC Course  I have reviewed the triage vital signs and the nursing notes.  Pertinent labs & imaging results that were available during my care of the patient were reviewed by me and considered in my medical decision making (see  chart for details).  Clinical Course       Final Clinical Impressions(s) / UC Diagnoses   Final diagnoses:  Visit for wound check    New Prescriptions Discharge Medication List as of 07/17/2016 11:17 AM    START taking these medications   Details  doxycycline (MONODOX) 100 MG capsule Take 1 capsule (100 mg total) by mouth 2 (two) times daily., Starting Fri 07/17/2016, Normal       1. diagnosis reviewed with patient; suture removed without complications 2. rx as per orders above; reviewed possible side effects, interactions, risks and benefits  3. Recommend supportive treatment with usual wound care 4. Follow-up prn if symptoms worsen or don't improve   Norval Gable, MD 07/17/16 1222

## 2016-07-17 NOTE — Discharge Instructions (Signed)
Follow up as needed

## 2016-07-17 NOTE — ED Notes (Signed)
25 sutures removed without issue.

## 2016-07-21 DIAGNOSIS — M6281 Muscle weakness (generalized): Secondary | ICD-10-CM | POA: Diagnosis not present

## 2016-07-21 DIAGNOSIS — M6289 Other specified disorders of muscle: Secondary | ICD-10-CM | POA: Diagnosis not present

## 2016-07-21 DIAGNOSIS — I890 Lymphedema, not elsewhere classified: Secondary | ICD-10-CM | POA: Diagnosis not present

## 2016-07-29 DIAGNOSIS — Z23 Encounter for immunization: Secondary | ICD-10-CM | POA: Diagnosis not present

## 2016-08-03 DIAGNOSIS — L570 Actinic keratosis: Secondary | ICD-10-CM | POA: Diagnosis not present

## 2016-08-03 DIAGNOSIS — C44622 Squamous cell carcinoma of skin of right upper limb, including shoulder: Secondary | ICD-10-CM | POA: Diagnosis not present

## 2016-08-03 DIAGNOSIS — D485 Neoplasm of uncertain behavior of skin: Secondary | ICD-10-CM | POA: Diagnosis not present

## 2016-08-04 DIAGNOSIS — M6281 Muscle weakness (generalized): Secondary | ICD-10-CM | POA: Diagnosis not present

## 2016-08-04 DIAGNOSIS — M6289 Other specified disorders of muscle: Secondary | ICD-10-CM | POA: Diagnosis not present

## 2016-08-04 DIAGNOSIS — I890 Lymphedema, not elsewhere classified: Secondary | ICD-10-CM | POA: Diagnosis not present

## 2016-08-10 DIAGNOSIS — C44622 Squamous cell carcinoma of skin of right upper limb, including shoulder: Secondary | ICD-10-CM | POA: Diagnosis not present

## 2016-08-15 ENCOUNTER — Ambulatory Visit
Admission: EM | Admit: 2016-08-15 | Discharge: 2016-08-15 | Disposition: A | Discharge status: Home / Self Care | Payer: Medicare Other | Attending: Family Medicine | Admitting: Family Medicine

## 2016-08-15 DIAGNOSIS — R21 Rash and other nonspecific skin eruption: Secondary | ICD-10-CM | POA: Diagnosis not present

## 2016-08-15 MED ORDER — DOXYCYCLINE HYCLATE 100 MG PO CAPS
100.0000 mg | ORAL_CAPSULE | Freq: Two times a day (BID) | ORAL | 0 refills | Status: AC
Start: 2016-08-15 — End: 2016-08-22

## 2016-08-15 MED ORDER — PREDNISONE 20 MG PO TABS: 40.0000 mg | ORAL_TABLET | Freq: Every day | ORAL | 0 refills | Status: AC

## 2016-08-15 NOTE — Discharge Instructions (Signed)
Take the prednisone and oral antibiotic as prescribed for your rash. I don't think you are having an allergic reaction. You must follow up with your dermatologist as scheduled for re-evaluation.

## 2016-08-15 NOTE — ED Provider Notes (Signed)
CSN: [id number]     Arrival date & time 08/15/16  1031 History   None    Chief Complaint  Patient presents with  . Rash   (Consider location/radiation/quality/duration/timing/severity/associated sxs/prior Treatment) Patient is a 82 y.o female, presents today for a rash to her left forearm. Patient has a healed laceration on her left hand resulted on 11/02 from a fall and sutures were removed on 11/10. Patient have been using the neosporin for the past 4 weeks on her left hand to prevent infection but noticed a rash on her left forearm yesterday. She denies using the neosporin on her forearm area. Rash has not gotten worst since yesterday. Rash is not itchy but "hurts". She denies burning sensation on the rash. Patient has a dermatologist that she sees on a regular  basis due to squamous cell carcinoma. Her next dermatology appointment is in on 12/18        Past Medical History:  Diagnosis Date  . Cancer ([place])   . Colitis, ulcerative ([place])   . Diverticulitis   . Hypertension    Diet controlled  . Kidney stone   . Rosacea   . Sciatica    Past Surgical History:  Procedure Laterality Date  . ABDOMINAL HYSTERECTOMY    . BREAST SURGERY Left   . EYE SURGERY     Lens implant  . MASTECTOMY     Left and Right   Family History  Problem Relation Age of Onset  . Cancer Mother   . Cancer Sister   . Cancer Sister    Social History  Substance Use Topics  . Smoking status: Never Smoker  . Smokeless tobacco: Never Used  . Alcohol use No   OB History    No data available     Review of Systems  All other systems reviewed and are negative.   Allergies  Cephalexin; Ciprofloxacin; Flagyl [metronidazole]; Gabapentin; Gluten meal; Lisinopril; Naproxen; Other; Oxycodone; Prevacid [lansoprazole]; Penicillins; Pseudoephedrine; Rogaine  [minoxidil]; Sulfa antibiotics; and Tramadol  Home Medications   Prior to Admission medications   Medication Sig Start Date End Date Taking?  Authorizing Provider  calcium carbonate (OS-CAL) 600 MG TABS tablet Take 600 mg by mouth 3 (three) times daily with meals.   Yes Historical Provider, MD  Multiple Vitamins-Minerals (PRESERVISION AREDS 2 PO) Take 1 tablet by mouth 2 (two) times daily.   Yes Historical Provider, MD  doxycycline (VIBRAMYCIN) 100 MG capsule Take 1 capsule (100 mg total) by mouth 2 (two) times daily. 08/15/16 08/22/16  Barry Dienes, NP  predniSONE (DELTASONE) 20 MG tablet Take 2 tablets (40 mg total) by mouth daily with breakfast. 08/15/16 08/21/16  Barry Dienes, NP   Meds Ordered and Administered this Visit  Medications - No data to display  BP (!) 192/92 (BP Location: Right Arm)   Pulse 63   Temp 97.9 F (36.6 C) (Oral)   Resp 16   Ht 4\' 10"  (1.473 m)   Wt 119 lb (54 kg)   SpO2 97%   BMI 24.87 kg/m  No data found.   Physical Exam  Constitutional: She is oriented to person, place, and time. She appears well-developed and well-nourished.  HENT:  Head: Normocephalic and atraumatic.  Neck: Neck supple.  Cardiovascular: Normal rate, regular rhythm and normal heart sounds.   No murmur heard. Pulmonary/Chest: Effort normal and breath sounds normal. No respiratory distress.  Lymphadenopathy:    She has no cervical adenopathy.  Neurological: She is alert and oriented to person, place, and  time.  Skin:  See picture below.   Both arm appears red and blotchy, which is chronic for her. Left upper forearm seems to be more erythematous than the rest of the extremity. She does have an area of macular rash on her left inner forearm. Rash non-tender to palpate, arms not even more warmer than normal to palpate. A long linear scar noted on her left hand from previous laceration.  Nursing note and vitals reviewed.         Urgent Care Course   Clinical Course     Procedures (including critical care time)  Labs Review Labs Reviewed - No data to display  Imaging Review No results found.  MDM   1. Rash     1) Don't suspect an allergic reaction from the neosporin as she has been using neosporin for 4 weeks AND she was only applying it on her hand area 2) No clear etiology for her rash. Rx for prednisone given.  3) There is a small concern for a possible infectious cause, will also tx with doxy BID x 7 days 4) Patient has appointment set up with dermatology on 12/18 for her squamous cell carcinoma. Informed to f/u earlier if her rash does not improve.     Barry Dienes, NP 08/15/16 1434

## 2016-08-15 NOTE — ED Triage Notes (Signed)
Patient states that she believes that she is having an allergic reaction to neosporin. Patient states that she has a rash on both hands and arms. Patient had a skin cancer surgery on Monday and still has a bandage intact.

## 2016-08-17 DIAGNOSIS — C44629 Squamous cell carcinoma of skin of left upper limb, including shoulder: Secondary | ICD-10-CM | POA: Diagnosis not present

## 2016-08-17 DIAGNOSIS — D485 Neoplasm of uncertain behavior of skin: Secondary | ICD-10-CM | POA: Diagnosis not present

## 2016-08-17 DIAGNOSIS — C4492 Squamous cell carcinoma of skin, unspecified: Secondary | ICD-10-CM | POA: Diagnosis not present

## 2016-08-21 DIAGNOSIS — M9905 Segmental and somatic dysfunction of pelvic region: Secondary | ICD-10-CM | POA: Diagnosis not present

## 2016-08-21 DIAGNOSIS — M545 Low back pain: Secondary | ICD-10-CM | POA: Diagnosis not present

## 2016-08-21 DIAGNOSIS — M729 Fibroblastic disorder, unspecified: Secondary | ICD-10-CM | POA: Diagnosis not present

## 2016-08-21 DIAGNOSIS — M9904 Segmental and somatic dysfunction of sacral region: Secondary | ICD-10-CM | POA: Diagnosis not present

## 2016-08-24 DIAGNOSIS — L570 Actinic keratosis: Secondary | ICD-10-CM | POA: Diagnosis not present

## 2016-08-24 DIAGNOSIS — C44629 Squamous cell carcinoma of skin of left upper limb, including shoulder: Secondary | ICD-10-CM | POA: Diagnosis not present

## 2016-08-25 DIAGNOSIS — L7621 Postprocedural hemorrhage and hematoma of skin and subcutaneous tissue following a dermatologic procedure: Secondary | ICD-10-CM | POA: Diagnosis not present

## 2016-09-14 DIAGNOSIS — Z859 Personal history of malignant neoplasm, unspecified: Secondary | ICD-10-CM | POA: Diagnosis not present

## 2016-09-14 DIAGNOSIS — Z872 Personal history of diseases of the skin and subcutaneous tissue: Secondary | ICD-10-CM | POA: Diagnosis not present

## 2016-09-26 DIAGNOSIS — M545 Low back pain: Secondary | ICD-10-CM | POA: Diagnosis not present

## 2016-09-26 DIAGNOSIS — M9904 Segmental and somatic dysfunction of sacral region: Secondary | ICD-10-CM | POA: Diagnosis not present

## 2016-09-26 DIAGNOSIS — M9905 Segmental and somatic dysfunction of pelvic region: Secondary | ICD-10-CM | POA: Diagnosis not present

## 2016-09-26 DIAGNOSIS — M729 Fibroblastic disorder, unspecified: Secondary | ICD-10-CM | POA: Diagnosis not present

## 2016-09-29 DIAGNOSIS — I890 Lymphedema, not elsewhere classified: Secondary | ICD-10-CM | POA: Diagnosis not present

## 2016-09-29 DIAGNOSIS — M6289 Other specified disorders of muscle: Secondary | ICD-10-CM | POA: Diagnosis not present

## 2016-09-29 DIAGNOSIS — M6281 Muscle weakness (generalized): Secondary | ICD-10-CM | POA: Diagnosis not present

## 2016-10-12 DIAGNOSIS — H353132 Nonexudative age-related macular degeneration, bilateral, intermediate dry stage: Secondary | ICD-10-CM | POA: Diagnosis not present

## 2016-10-19 DIAGNOSIS — I890 Lymphedema, not elsewhere classified: Secondary | ICD-10-CM | POA: Diagnosis not present

## 2016-10-22 DIAGNOSIS — I6523 Occlusion and stenosis of bilateral carotid arteries: Secondary | ICD-10-CM | POA: Diagnosis not present

## 2016-10-22 DIAGNOSIS — G9619 Other disorders of meninges, not elsewhere classified: Secondary | ICD-10-CM | POA: Diagnosis not present

## 2016-10-22 DIAGNOSIS — R2990 Unspecified symptoms and signs involving the nervous system: Secondary | ICD-10-CM | POA: Diagnosis not present

## 2016-10-22 DIAGNOSIS — Z8673 Personal history of transient ischemic attack (TIA), and cerebral infarction without residual deficits: Secondary | ICD-10-CM | POA: Diagnosis not present

## 2016-10-22 DIAGNOSIS — G319 Degenerative disease of nervous system, unspecified: Secondary | ICD-10-CM | POA: Diagnosis not present

## 2016-10-22 DIAGNOSIS — Z880 Allergy status to penicillin: Secondary | ICD-10-CM | POA: Diagnosis not present

## 2016-10-22 DIAGNOSIS — Z885 Allergy status to narcotic agent status: Secondary | ICD-10-CM | POA: Diagnosis not present

## 2016-10-22 DIAGNOSIS — Z9102 Food additives allergy status: Secondary | ICD-10-CM | POA: Diagnosis not present

## 2016-10-22 DIAGNOSIS — R930 Abnormal findings on diagnostic imaging of skull and head, not elsewhere classified: Secondary | ICD-10-CM | POA: Diagnosis not present

## 2016-10-22 DIAGNOSIS — Z170 Estrogen receptor positive status [ER+]: Secondary | ICD-10-CM | POA: Diagnosis not present

## 2016-10-22 DIAGNOSIS — C50812 Malignant neoplasm of overlapping sites of left female breast: Secondary | ICD-10-CM | POA: Diagnosis not present

## 2016-10-22 DIAGNOSIS — R4701 Aphasia: Secondary | ICD-10-CM | POA: Diagnosis not present

## 2016-10-22 DIAGNOSIS — Z79899 Other long term (current) drug therapy: Secondary | ICD-10-CM | POA: Diagnosis not present

## 2016-10-22 DIAGNOSIS — R29818 Other symptoms and signs involving the nervous system: Secondary | ICD-10-CM | POA: Diagnosis not present

## 2016-10-22 DIAGNOSIS — Z881 Allergy status to other antibiotic agents status: Secondary | ICD-10-CM | POA: Diagnosis not present

## 2016-10-22 DIAGNOSIS — R269 Unspecified abnormalities of gait and mobility: Secondary | ICD-10-CM | POA: Diagnosis not present

## 2016-10-22 DIAGNOSIS — Z883 Allergy status to other anti-infective agents status: Secondary | ICD-10-CM | POA: Diagnosis not present

## 2016-10-22 DIAGNOSIS — I440 Atrioventricular block, first degree: Secondary | ICD-10-CM | POA: Diagnosis not present

## 2016-10-22 DIAGNOSIS — Z882 Allergy status to sulfonamides status: Secondary | ICD-10-CM | POA: Diagnosis not present

## 2016-10-22 DIAGNOSIS — I671 Cerebral aneurysm, nonruptured: Secondary | ICD-10-CM | POA: Diagnosis not present

## 2016-10-22 DIAGNOSIS — F809 Developmental disorder of speech and language, unspecified: Secondary | ICD-10-CM | POA: Diagnosis not present

## 2016-10-22 DIAGNOSIS — Z9013 Acquired absence of bilateral breasts and nipples: Secondary | ICD-10-CM | POA: Diagnosis not present

## 2016-10-22 DIAGNOSIS — Z87442 Personal history of urinary calculi: Secondary | ICD-10-CM | POA: Diagnosis not present

## 2016-10-22 DIAGNOSIS — I890 Lymphedema, not elsewhere classified: Secondary | ICD-10-CM | POA: Diagnosis not present

## 2016-10-22 DIAGNOSIS — Z888 Allergy status to other drugs, medicaments and biological substances status: Secondary | ICD-10-CM | POA: Diagnosis not present

## 2016-10-22 DIAGNOSIS — I10 Essential (primary) hypertension: Secondary | ICD-10-CM | POA: Diagnosis not present

## 2016-10-22 DIAGNOSIS — M4802 Spinal stenosis, cervical region: Secondary | ICD-10-CM | POA: Diagnosis not present

## 2016-10-22 DIAGNOSIS — R918 Other nonspecific abnormal finding of lung field: Secondary | ICD-10-CM | POA: Diagnosis not present

## 2016-10-22 DIAGNOSIS — R479 Unspecified speech disturbances: Secondary | ICD-10-CM | POA: Diagnosis not present

## 2016-10-22 DIAGNOSIS — Z85828 Personal history of other malignant neoplasm of skin: Secondary | ICD-10-CM | POA: Diagnosis not present

## 2016-10-23 DIAGNOSIS — R479 Unspecified speech disturbances: Secondary | ICD-10-CM | POA: Diagnosis not present

## 2016-10-23 DIAGNOSIS — F809 Developmental disorder of speech and language, unspecified: Secondary | ICD-10-CM | POA: Diagnosis not present

## 2016-10-23 DIAGNOSIS — I10 Essential (primary) hypertension: Secondary | ICD-10-CM | POA: Diagnosis not present

## 2016-10-23 DIAGNOSIS — I340 Nonrheumatic mitral (valve) insufficiency: Secondary | ICD-10-CM | POA: Diagnosis not present

## 2016-10-23 DIAGNOSIS — R2990 Unspecified symptoms and signs involving the nervous system: Secondary | ICD-10-CM | POA: Diagnosis not present

## 2016-10-23 DIAGNOSIS — R269 Unspecified abnormalities of gait and mobility: Secondary | ICD-10-CM | POA: Diagnosis not present

## 2016-10-27 DIAGNOSIS — H6123 Impacted cerumen, bilateral: Secondary | ICD-10-CM | POA: Diagnosis not present

## 2016-10-27 DIAGNOSIS — I890 Lymphedema, not elsewhere classified: Secondary | ICD-10-CM | POA: Diagnosis not present

## 2016-10-27 DIAGNOSIS — H6120 Impacted cerumen, unspecified ear: Secondary | ICD-10-CM | POA: Diagnosis not present

## 2016-10-27 DIAGNOSIS — H903 Sensorineural hearing loss, bilateral: Secondary | ICD-10-CM | POA: Diagnosis not present

## 2016-10-29 DIAGNOSIS — I890 Lymphedema, not elsewhere classified: Secondary | ICD-10-CM | POA: Diagnosis not present

## 2016-10-30 ENCOUNTER — Ambulatory Visit (INDEPENDENT_AMBULATORY_CARE_PROVIDER_SITE_OTHER): Payer: Medicare Other | Admitting: Family Medicine

## 2016-10-30 VITALS — BP 120/80 | HR 80 | Ht <= 58 in | Wt 121.0 lb

## 2016-10-30 DIAGNOSIS — I679 Cerebrovascular disease, unspecified: Secondary | ICD-10-CM

## 2016-10-30 DIAGNOSIS — Z09 Encounter for follow-up examination after completed treatment for conditions other than malignant neoplasm: Secondary | ICD-10-CM

## 2016-10-30 DIAGNOSIS — I10 Essential (primary) hypertension: Secondary | ICD-10-CM

## 2016-10-30 MED ORDER — AMLODIPINE BESYLATE 5 MG PO TABS: 5.0000 mg | ORAL_TABLET | Freq: Every day | ORAL | 11 refills | Status: DC

## 2016-10-30 MED ORDER — ASPIRIN EC 81 MG PO TBEC: 81.0000 mg | DELAYED_RELEASE_TABLET | Freq: Every day | ORAL | 11 refills | Status: DC

## 2016-10-30 NOTE — Progress Notes (Signed)
Name: Tammy Mcdaniel   MRN: [id number]    DOB: [birth year]   Date:10/30/2016       Progress Note  Subjective  Chief Complaint  Chief Complaint  Patient presents with  . Follow-up    hospital d/c on 10/23/2016. DX with TIA/ "I couldn't talk"    Patient presents for follow up of hospital admission.    No problem-specific Assessment & Plan notes found for this encounter.   Past Medical History:  Diagnosis Date  . Cancer ([place])   . Colitis, ulcerative ([place])   . Diverticulitis   . Hypertension    Diet controlled  . Kidney stone   . Rosacea   . Sciatica     Past Surgical History:  Procedure Laterality Date  . ABDOMINAL HYSTERECTOMY    . BREAST SURGERY Left   . EYE SURGERY     Lens implant  . MASTECTOMY     Left and Right    Family History  Problem Relation Age of Onset  . Cancer Mother   . Cancer Sister   . Cancer Sister     Social History   Social History  . Marital status: Single    Spouse name: N/A  . Number of children: N/A  . Years of education: N/A   Occupational History  . Not on file.   Social History Main Topics  . Smoking status: Never Smoker  . Smokeless tobacco: Never Used  . Alcohol use No  . Drug use: No  . Sexual activity: Not Currently   Other Topics Concern  . Not on file   Social History Narrative  . No narrative on file    Allergies  Allergen Reactions  . Cephalexin Diarrhea  . Ciprofloxacin Diarrhea and Other (See Comments)    Internal bleeding  . Flagyl [Metronidazole] Diarrhea  . Gabapentin Other (See Comments)    Dizziness  . Gluten Meal Other (See Comments)    Other Reaction: GI Upset  . Lisinopril Diarrhea    Severe diarrhea  . Naproxen Other (See Comments)    RECTAL BLEEDING  . Other Hives  . Oxycodone Other (See Comments)    Constipation   . Prevacid [Lansoprazole] Diarrhea  . Penicillins Rash  . Pseudoephedrine Rash  . Rogaine  [Minoxidil] Rash  . Sulfa Antibiotics Rash  . Tramadol Rash    Outpatient  Medications Prior to Visit  Medication Sig Dispense Refill  . calcium carbonate (OS-CAL) 600 MG TABS tablet Take 600 mg by mouth 3 (three) times daily with meals.    . Multiple Vitamins-Minerals (PRESERVISION AREDS 2 PO) Take 1 tablet by mouth 2 (two) times daily.     No facility-administered medications prior to visit.     Review of Systems  Constitutional: Negative for chills, fever, malaise/fatigue and weight loss.  HENT: Negative for ear discharge, ear pain and sore throat.   Eyes: Negative for blurred vision.  Respiratory: Negative for cough, sputum production, shortness of breath and wheezing.   Cardiovascular: Negative for chest pain, palpitations and leg swelling.  Gastrointestinal: Negative for abdominal pain, blood in stool, constipation, diarrhea, heartburn, melena and nausea.  Genitourinary: Negative for dysuria, frequency, hematuria and urgency.  Musculoskeletal: Negative for back pain, joint pain, myalgias and neck pain.  Skin: Negative for rash.  Neurological: Negative for dizziness, tingling, sensory change, focal weakness and headaches.  Endo/Heme/Allergies: Negative for environmental allergies and polydipsia. Does not bruise/bleed easily.  Psychiatric/Behavioral: Negative for depression and suicidal ideas. The patient is not nervous/anxious and does  not have insomnia.      Objective  Vitals:   10/30/16 1104  BP: 120/80  Pulse: 80  Weight: 121 lb (54.9 kg)  Height: 4\' 10"  (1.473 m)    Physical Exam  Constitutional: She is well-developed, well-nourished, and in no distress. No distress.  HENT:  Head: Normocephalic and atraumatic.  Right Ear: External ear normal.  Left Ear: External ear normal.  Nose: Nose normal.  Mouth/Throat: Oropharynx is clear and moist.  Eyes: Conjunctivae and EOM are normal. Pupils are equal, round, and reactive to light. Right eye exhibits no discharge. Left eye exhibits no discharge.  Neck: Normal range of motion. Neck supple. No JVD  present. No thyromegaly present.  Cardiovascular: Normal rate, regular rhythm, normal heart sounds and intact distal pulses.  Exam reveals no gallop and no friction rub.   No murmur heard. Pulmonary/Chest: Effort normal and breath sounds normal. She has no wheezes. She has no rales.  Abdominal: Soft. Bowel sounds are normal. She exhibits no mass. There is no tenderness. There is no guarding.  Musculoskeletal: Normal range of motion. She exhibits no edema.  Lymphadenopathy:    She has no cervical adenopathy.  Neurological: She is alert. She has normal reflexes.  Skin: Skin is warm and dry. She is not diaphoretic.  Psychiatric: Mood and affect normal.  Nursing note and vitals reviewed.     Assessment & Plan  Problem List Items Addressed This Visit    None    Visit Diagnoses    Hospital discharge follow-up    -  Primary   Essential hypertension       Relevant Medications   amLODipine (NORVASC) 5 MG tablet   aspirin EC 81 MG tablet   Cerebral vascular disease       life line set-up initiated   Relevant Medications   amLODipine (NORVASC) 5 MG tablet   aspirin EC 81 MG tablet      Meds ordered this encounter  Medications  . amLODipine (NORVASC) 5 MG tablet    Sig: Take 1 tablet (5 mg total) by mouth daily.    Dispense:  30 tablet    Refill:  11  . aspirin EC 81 MG tablet    Sig: Take 1 tablet (81 mg total) by mouth daily.    Dispense:  30 tablet    Refill:  11      Dr. Leory Allinson [facility]  10/30/16

## 2016-10-30 NOTE — Patient Instructions (Signed)
Ischemic Stroke °An ischemic stroke (cerebrovascular accident, or CVA) is the sudden death of brain tissue that occurs when an area of the brain does not get enough oxygen. It is a medical emergency that must be treated right away. An ischemic stroke can cause permanent loss of brain function. This can cause problems with how different parts of your body function. °What are the causes? °This condition is caused by a decrease of oxygen supply to an area of the brain, which may be the result of: °· A small blood clot (embolus) or a buildup of plaque in the blood vessels (atherosclerosis) that blocks blood flow in the brain. °· An abnormal heart rhythm (atrial fibrillation). °· A blocked or damaged artery in the head or neck. °What increases the risk? °Certain factors may make you more likely to develop this condition. Some of these factors are things that you can change, such as: °· Obesity. °· Smoking cigarettes. °· Taking oral birth control, especially if you also use tobacco. °· Physical inactivity. °· Excessive alcohol use. °· Use of illegal drugs, especially cocaine and methamphetamine. °Other risk factors include: °· High blood pressure (hypertension). °· High cholesterol. °· Diabetes mellitus. °· Heart disease. °· Being African American, Native American, Hispanic, or [place] Native. °· Being over age 60. °· Family history of stroke. °· Previous history of blood clots, stroke, or transient ischemic attack (TIA). °· Sickle cell disease. °· Being a woman with a history of preeclampsia. °· Migraine headache. °· Sleep apnea. °· Irregular heartbeats, such as atrial fibrillation. °· Chronic inflammatory diseases, such as rheumatoid arthritis or lupus. °· Blood clotting disorders (hypercoagulable state). °What are the signs or symptoms? °Symptoms of this condition usually develop suddenly, or you may notice them after waking up from sleep. Symptoms may include sudden: °· Weakness or numbness in your face, arm, or leg,  especially on one side of your body. °· Trouble walking or difficulty moving your arms or legs. °· Loss of balance or coordination. °· Confusion. °· Slurred speech (dysarthria). °· Trouble speaking, understanding speech, or both (aphasia). °· Vision changes--such as double vision, blurred vision, or loss of vision--in one or both eyes. °· Dizziness. °· Nausea and vomiting. °· Severe headache with no known cause. The headache is often described as the worst headache ever experienced. °If possible, make note of the exact time that you last felt like your normal self and what time your symptoms started. Tell your health care provider. °If symptoms come and go, this could be a sign of a warning stroke, or TIA. Get help right away, even if you feel better. °How is this diagnosed? °This condition may be diagnosed based on: °· Your symptoms, your medical history, and a physical exam. °· CT scan of the brain. °· MRI. °· CT angiogram. This test uses a computer to take X-rays of your arteries. A dye may be injected into your blood to show the inside of your blood vessels more clearly. °· MRI angiogram. This is a type of MRI that is used to evaluate the blood vessels. °· Cerebral angiogram. This test uses X-rays and a dye to show the blood vessels in the brain and neck. °You may need to see a health care provider who specializes in stroke care. A stroke specialist can be seen in person or through communication using telephone or television technology (telemedicine). °Other tests may also be done to find the cause of the stroke, such as: °· Electrocardiogram (ECG). °· Continuous heart monitoring. °· Echocardiogram. °·   Carotid ultrasound. °· A scan of the brain circulation. °· Blood tests. °· Sleep study to check for sleep apnea. °How is this treated? °Treatment for this condition will depend on the duration, severity, and cause of your symptoms and on the area of the brain affected. It is very important to get treatment at the  first sign of stroke symptoms. Some treatments work better if they are done within 3-6 hours of the onset of stroke symptoms. These initial treatments may include: °· Aspirin. °· Medicines to control blood pressure. °· Medicine given by injection to dissolve the blood clot (thrombolytic). °· Treatments given directly to the affected artery to remove or dissolve the blood clot. °Other treatment options may include: °· Oxygen. °· IV fluids. °· Medicines to thin the blood (anticoagulants or antiplatelets). °· Procedures to increase blood flow. °Medicines and changes to your diet may be used to help treat and manage risk factors for stroke, such as diabetes, high cholesterol, and high blood pressure. °After a stroke, you may work with physical, speech, mental health, or occupational therapists to help you recover. °Follow these instructions at home: °Medicines  °· Take over-the-counter and prescription medicines only as told by your health care provider. °· If you were told to take a medicine to thin your blood, such as aspirin or an anticoagulant, take it exactly as told by your health care provider. °¨ Taking too much blood-thinning medicine can cause bleeding. °¨ If you do not take enough blood-thinning medicine, you will not have the protection that you need against another stroke and other problems. °· Understand the side effects of taking anticoagulant medicine. When taking this type of medicine, make sure you: °¨ Hold pressure over any cuts for longer than usual. °¨ Tell your dentist and other health care providers that you are taking anticoagulants before you have any procedures that may cause bleeding. °¨ Avoid activities that may cause trauma or injury. °Eating and drinking  °· Follow instructions from your health care provider about diet. °· Eat healthy foods. °· If your ability to swallow was affected by the stroke, you may need to take steps to avoid choking, such as: °¨ Taking small bites when  eating. °¨ Eating foods that are soft or pureed. °Safety  °· Follow instructions from your health care team about physical activity. °· Use a walker or cane as told by your health care provider. °· Take steps to create a safe home environment in order to reduce the risk of falls. This may include: °¨ Having your home looked at by specialists. °¨ Installing grab bars in the bedroom and bathroom. °¨ Using safety equipment, such as raised toilets and a seat in the shower. °General instructions  °· Do not use any tobacco products, such as cigarettes, chewing tobacco, and e-cigarettes. If you need help quitting, ask your health care provider. °· Limit alcohol intake to no more than 1 drink a day for nonpregnant women and 2 drinks a day for men. One drink equals 12 oz of beer, 5 oz of wine, or 1½ oz of hard liquor. °· If you need help to stop using drugs or alcohol, ask your health care provider about a referral to a program or specialist. °· Maintain an active and healthy lifestyle. Get regular exercise as told by your health care provider. °· Keep all follow-up visits as told by your health care provider, including visits with all specialists on your health care team. This is important. °How is this prevented? °Your   risk of another stroke can be decreased by managing high blood pressure, high cholesterol, diabetes, heart disease, sleep apnea, and obesity. It can also be decreased by quitting smoking, limiting alcohol, and staying physically active. °Your health care provider will continue to work with you on measures to prevent short-term and long-term complications of stroke. °Get help right away if: °You have: °· Sudden weakness or numbness in your face, arm, or leg, especially on one side of your body. °· Sudden confusion. °· Sudden trouble speaking, understanding, or both (aphasia). °· Sudden trouble seeing with one or both eyes. °· Sudden trouble walking or difficulty moving your arms or legs. °· Sudden  dizziness. °· Sudden loss of balance or coordination. °· Sudden, severe headache with no known cause. °· A partial or total loss of consciousness. °· A seizure. °Any of these symptoms may represent a serious problem that is an emergency. Do not wait to see if the symptoms will go away. Get medical help right away. Call your local emergency services (911 in [place] Do not drive yourself to the hospital. °This information is not intended to replace advice given to you by your health care provider. Make sure you discuss any questions you have with your health care provider. °Document Released: 08/24/2005 Document Revised: 02/04/2016 Document Reviewed: 11/20/2015 °[employer]vier Interactive Patient Education © 2017 [employer] ° °

## 2016-11-03 DIAGNOSIS — I890 Lymphedema, not elsewhere classified: Secondary | ICD-10-CM | POA: Diagnosis not present

## 2016-11-05 DIAGNOSIS — M9903 Segmental and somatic dysfunction of lumbar region: Secondary | ICD-10-CM | POA: Diagnosis not present

## 2016-11-05 DIAGNOSIS — M729 Fibroblastic disorder, unspecified: Secondary | ICD-10-CM | POA: Diagnosis not present

## 2016-11-05 DIAGNOSIS — I890 Lymphedema, not elsewhere classified: Secondary | ICD-10-CM | POA: Diagnosis not present

## 2016-11-05 DIAGNOSIS — M9904 Segmental and somatic dysfunction of sacral region: Secondary | ICD-10-CM | POA: Diagnosis not present

## 2016-11-05 DIAGNOSIS — M545 Low back pain: Secondary | ICD-10-CM | POA: Diagnosis not present

## 2016-11-10 DIAGNOSIS — I890 Lymphedema, not elsewhere classified: Secondary | ICD-10-CM | POA: Diagnosis not present

## 2016-11-17 DIAGNOSIS — I890 Lymphedema, not elsewhere classified: Secondary | ICD-10-CM | POA: Diagnosis not present

## 2016-12-03 DIAGNOSIS — I890 Lymphedema, not elsewhere classified: Secondary | ICD-10-CM | POA: Diagnosis not present

## 2016-12-03 DIAGNOSIS — M9904 Segmental and somatic dysfunction of sacral region: Secondary | ICD-10-CM | POA: Diagnosis not present

## 2016-12-03 DIAGNOSIS — M545 Low back pain: Secondary | ICD-10-CM | POA: Diagnosis not present

## 2016-12-03 DIAGNOSIS — M9903 Segmental and somatic dysfunction of lumbar region: Secondary | ICD-10-CM | POA: Diagnosis not present

## 2016-12-03 DIAGNOSIS — M729 Fibroblastic disorder, unspecified: Secondary | ICD-10-CM | POA: Diagnosis not present

## 2016-12-23 DIAGNOSIS — L0201 Cutaneous abscess of face: Secondary | ICD-10-CM | POA: Diagnosis not present

## 2016-12-23 DIAGNOSIS — L218 Other seborrheic dermatitis: Secondary | ICD-10-CM | POA: Diagnosis not present

## 2016-12-23 DIAGNOSIS — K130 Diseases of lips: Secondary | ICD-10-CM | POA: Diagnosis not present

## 2017-01-07 DIAGNOSIS — M9903 Segmental and somatic dysfunction of lumbar region: Secondary | ICD-10-CM | POA: Diagnosis not present

## 2017-01-07 DIAGNOSIS — M546 Pain in thoracic spine: Secondary | ICD-10-CM | POA: Diagnosis not present

## 2017-01-07 DIAGNOSIS — M729 Fibroblastic disorder, unspecified: Secondary | ICD-10-CM | POA: Diagnosis not present

## 2017-01-07 DIAGNOSIS — M9902 Segmental and somatic dysfunction of thoracic region: Secondary | ICD-10-CM | POA: Diagnosis not present

## 2017-01-26 ENCOUNTER — Emergency Department: Payer: Medicare Other

## 2017-01-26 ENCOUNTER — Observation Stay
Admission: EM | Admit: 2017-01-26 | Discharge: 2017-01-28 | Disposition: A | Discharge status: Home Health | Payer: Medicare Other | Attending: Specialist | Admitting: Specialist

## 2017-01-26 ENCOUNTER — Observation Stay: Payer: Medicare Other

## 2017-01-26 DIAGNOSIS — I951 Orthostatic hypotension: Principal | ICD-10-CM | POA: Insufficient documentation

## 2017-01-26 DIAGNOSIS — W1839XA Other fall on same level, initial encounter: Secondary | ICD-10-CM | POA: Insufficient documentation

## 2017-01-26 DIAGNOSIS — I7090 Unspecified atherosclerosis: Secondary | ICD-10-CM | POA: Diagnosis not present

## 2017-01-26 DIAGNOSIS — S0181XA Laceration without foreign body of other part of head, initial encounter: Secondary | ICD-10-CM | POA: Insufficient documentation

## 2017-01-26 DIAGNOSIS — Z886 Allergy status to analgesic agent status: Secondary | ICD-10-CM | POA: Diagnosis not present

## 2017-01-26 DIAGNOSIS — I495 Sick sinus syndrome: Secondary | ICD-10-CM | POA: Diagnosis not present

## 2017-01-26 DIAGNOSIS — Z881 Allergy status to other antibiotic agents status: Secondary | ICD-10-CM | POA: Diagnosis not present

## 2017-01-26 DIAGNOSIS — I4891 Unspecified atrial fibrillation: Secondary | ICD-10-CM

## 2017-01-26 DIAGNOSIS — Z7982 Long term (current) use of aspirin: Secondary | ICD-10-CM | POA: Diagnosis not present

## 2017-01-26 DIAGNOSIS — Z885 Allergy status to narcotic agent status: Secondary | ICD-10-CM | POA: Diagnosis not present

## 2017-01-26 DIAGNOSIS — R001 Bradycardia, unspecified: Secondary | ICD-10-CM | POA: Diagnosis not present

## 2017-01-26 DIAGNOSIS — Z9013 Acquired absence of bilateral breasts and nipples: Secondary | ICD-10-CM | POA: Insufficient documentation

## 2017-01-26 DIAGNOSIS — R4182 Altered mental status, unspecified: Secondary | ICD-10-CM | POA: Diagnosis not present

## 2017-01-26 DIAGNOSIS — I480 Paroxysmal atrial fibrillation: Secondary | ICD-10-CM | POA: Insufficient documentation

## 2017-01-26 DIAGNOSIS — M810 Age-related osteoporosis without current pathological fracture: Secondary | ICD-10-CM | POA: Insufficient documentation

## 2017-01-26 DIAGNOSIS — Z90710 Acquired absence of both cervix and uterus: Secondary | ICD-10-CM | POA: Insufficient documentation

## 2017-01-26 DIAGNOSIS — R55 Syncope and collapse: Secondary | ICD-10-CM | POA: Diagnosis not present

## 2017-01-26 DIAGNOSIS — S14109A Unspecified injury at unspecified level of cervical spinal cord, initial encounter: Secondary | ICD-10-CM | POA: Diagnosis not present

## 2017-01-26 DIAGNOSIS — S0990XA Unspecified injury of head, initial encounter: Secondary | ICD-10-CM | POA: Diagnosis not present

## 2017-01-26 DIAGNOSIS — Z8673 Personal history of transient ischemic attack (TIA), and cerebral infarction without residual deficits: Secondary | ICD-10-CM | POA: Diagnosis not present

## 2017-01-26 DIAGNOSIS — I10 Essential (primary) hypertension: Secondary | ICD-10-CM | POA: Insufficient documentation

## 2017-01-26 DIAGNOSIS — Z91018 Allergy to other foods: Secondary | ICD-10-CM | POA: Diagnosis not present

## 2017-01-26 DIAGNOSIS — S0191XA Laceration without foreign body of unspecified part of head, initial encounter: Secondary | ICD-10-CM | POA: Diagnosis not present

## 2017-01-26 DIAGNOSIS — S199XXA Unspecified injury of neck, initial encounter: Secondary | ICD-10-CM | POA: Diagnosis not present

## 2017-01-26 DIAGNOSIS — S299XXA Unspecified injury of thorax, initial encounter: Secondary | ICD-10-CM | POA: Diagnosis not present

## 2017-01-26 DIAGNOSIS — Z79899 Other long term (current) drug therapy: Secondary | ICD-10-CM | POA: Insufficient documentation

## 2017-01-26 DIAGNOSIS — K5190 Ulcerative colitis, unspecified, without complications: Secondary | ICD-10-CM | POA: Insufficient documentation

## 2017-01-26 DIAGNOSIS — I639 Cerebral infarction, unspecified: Secondary | ICD-10-CM

## 2017-01-26 DIAGNOSIS — Z880 Allergy status to penicillin: Secondary | ICD-10-CM | POA: Insufficient documentation

## 2017-01-26 LAB — BASIC METABOLIC PANEL
Anion gap: 7 (ref 5–15)
BUN: 18 mg/dL (ref 6–20)
CHLORIDE: 104 mmol/L (ref 101–111)
CO2: 28 mmol/L (ref 22–32)
Calcium: 9.5 mg/dL (ref 8.9–10.3)
Creatinine, Ser: 0.99 mg/dL (ref 0.44–1.00)
GFR calc Af Amer: 56 mL/min — ABNORMAL LOW (ref >60)
GFR, EST NON AFRICAN AMERICAN: 48 mL/min — AB (ref >60)
GLUCOSE: 124 mg/dL — AB (ref 65–99)
POTASSIUM: 4.8 mmol/L (ref 3.5–5.1)
SODIUM: 139 mmol/L (ref 135–145)

## 2017-01-26 LAB — CBC WITH DIFFERENTIAL/PLATELET
BASOS ABS: 0 10*3/uL (ref 0–0.1)
BASOS PCT: 0 %
Eosinophils Absolute: 0.1 10*3/uL (ref 0–0.7)
Eosinophils Relative: 1 %
HEMATOCRIT: 48.6 % — AB (ref 35.0–47.0)
HEMOGLOBIN: 15.9 g/dL (ref 12.0–16.0)
LYMPHS ABS: 1 10*3/uL (ref 1.0–3.6)
LYMPHS PCT: 11 %
MCH: 30.7 pg (ref 26.0–34.0)
MCHC: 32.6 g/dL (ref 32.0–36.0)
MCV: 94 fL (ref 80.0–100.0)
Monocytes Absolute: 0.4 10*3/uL (ref 0.2–0.9)
Monocytes Relative: 4 %
Neutro Abs: 7.5 10*3/uL — ABNORMAL HIGH (ref 1.4–6.5)
Neutrophils Relative %: 84 %
Platelets: 267 10*3/uL (ref 150–440)
RBC: 5.17 MIL/uL (ref 3.80–5.20)
RDW: 14.2 % (ref 11.5–14.5)
WBC: 9.1 10*3/uL (ref 3.6–11.0)

## 2017-01-26 LAB — TROPONIN I
Troponin I: <0.03 ng/mL (ref <0.03)
Troponin I: <0.03 ng/mL (ref <0.03)

## 2017-01-26 LAB — LIPASE, BLOOD: LIPASE: 34 U/L (ref 11–51)

## 2017-01-26 LAB — HEPATIC FUNCTION PANEL
ALK PHOS: 101 U/L (ref 38–126)
ALT: 16 U/L (ref 14–54)
AST: 26 U/L (ref 15–41)
Albumin: 4.2 g/dL (ref 3.5–5.0)
Bilirubin, Direct: <0.1 mg/dL — ABNORMAL LOW (ref 0.1–0.5)
TOTAL PROTEIN: 7.1 g/dL (ref 6.5–8.1)
Total Bilirubin: 0.6 mg/dL (ref 0.3–1.2)

## 2017-01-26 LAB — PROTIME-INR
INR: 0.9
Prothrombin Time: 12.1 seconds (ref 11.4–15.2)

## 2017-01-26 LAB — BRAIN NATRIURETIC PEPTIDE
B Natriuretic Peptide: 203 pg/mL — ABNORMAL HIGH (ref 0.0–100.0)
B Natriuretic Peptide: 188 pg/mL — ABNORMAL HIGH (ref 0.0–100.0)

## 2017-01-26 LAB — GLUCOSE, CAPILLARY: Glucose-Capillary: 89 mg/dL (ref 65–99)

## 2017-01-26 LAB — MAGNESIUM: Magnesium: 1.9 mg/dL (ref 1.7–2.4)

## 2017-01-26 MED ORDER — CALCIUM CARBONATE 600 MG PO TABS: 600.0000 mg | ORAL_TABLET | Freq: Three times a day (TID) | ORAL | Status: DC

## 2017-01-26 MED ORDER — ENOXAPARIN SODIUM 40 MG/0.4ML ~~LOC~~ SOLN: 40.0000 mg | SUBCUTANEOUS | Status: DC

## 2017-01-26 MED ORDER — OCUVITE-LUTEIN PO CAPS
1.0000 | ORAL_CAPSULE | Freq: Two times a day (BID) | ORAL | Status: DC
  Administered 2017-01-26 – 2017-01-28 (×5): 1 via ORAL
  Filled 2017-01-26 (×5): qty 1

## 2017-01-26 MED ORDER — ACETAMINOPHEN 325 MG PO TABS
650.0000 mg | ORAL_TABLET | Freq: Four times a day (QID) | ORAL | Status: DC | PRN
  Administered 2017-01-26 – 2017-01-28 (×5): 650 mg via ORAL
  Filled 2017-01-26 (×5): qty 2

## 2017-01-26 MED ORDER — ACETAMINOPHEN 650 MG RE SUPP: 650.0000 mg | Freq: Four times a day (QID) | RECTAL | Status: DC | PRN

## 2017-01-26 MED ORDER — ACETAMINOPHEN 500 MG PO TABS
1000.0000 mg | ORAL_TABLET | Freq: Once | ORAL | Status: AC
  Administered 2017-01-26: 1000 mg via ORAL
  Filled 2017-01-26: qty 2

## 2017-01-26 MED ORDER — ONDANSETRON HCL 4 MG/2ML IJ SOLN: 4.0000 mg | Freq: Four times a day (QID) | INTRAMUSCULAR | Status: DC | PRN

## 2017-01-26 MED ORDER — ONDANSETRON HCL 4 MG PO TABS: 4.0000 mg | ORAL_TABLET | Freq: Four times a day (QID) | ORAL | Status: DC | PRN

## 2017-01-26 MED ORDER — CALCIUM CARBONATE ANTACID 500 MG PO CHEW
600.0000 mg | CHEWABLE_TABLET | Freq: Three times a day (TID) | ORAL | Status: DC
  Filled 2017-01-26 (×2): qty 3

## 2017-01-26 MED ORDER — ENOXAPARIN SODIUM 30 MG/0.3ML ~~LOC~~ SOLN
30.0000 mg | SUBCUTANEOUS | Status: DC
  Administered 2017-01-26: 30 mg via SUBCUTANEOUS
  Filled 2017-01-26: qty 0.3

## 2017-01-26 MED ORDER — ASPIRIN EC 81 MG PO TBEC
81.0000 mg | DELAYED_RELEASE_TABLET | Freq: Every day | ORAL | Status: DC
  Administered 2017-01-26 – 2017-01-27 (×2): 81 mg via ORAL
  Filled 2017-01-26 (×2): qty 1

## 2017-01-26 MED ORDER — LIDOCAINE-EPINEPHRINE 1 %-1:100000 IJ SOLN
10.0000 mL | Freq: Once | INTRAMUSCULAR | Status: DC
  Filled 2017-01-26 (×3): qty 10

## 2017-01-26 NOTE — Progress Notes (Signed)
Anticoagulation monitoring(Lovenox):  83yo  F ordered Lovenox 40 mg Q24h  Filed Weights   01/26/17 0754  Weight: 120 lb (54.4 kg)   BMI 22   Lab Results  Component Value Date   CREATININE 0.99 01/26/2017   CREATININE 0.97 10/14/2015   CREATININE 1.22 08/01/2014   Estimated Creatinine Clearance: 28.7 mL/min (by C-G formula based on SCr of 0.99 mg/dL). Hemoglobin & Hematocrit     Component Value Date/Time   HGB 15.9 01/26/2017 0756   HGB 14.5 08/01/2014 1247   HCT 48.6 (H) 01/26/2017 0756   HCT 45.4 08/01/2014 1247     Per Protocol for Patient with estCrcl < 30 ml/min and BMI < 40, will transition to Lovenox 30 mg Q24h      Chinita Greenland PharmD Clinical Pharmacist 01/26/2017

## 2017-01-26 NOTE — Progress Notes (Signed)
Pt had a 2.09 & a 2.25 second pause. Pt is asymptomatic. MD was paged, Dr. Margaretmary Eddy gave orders to get an EKG, a magnesium & a BNP. Will continue to monitor.

## 2017-01-26 NOTE — ED Notes (Signed)
Cleaned patient scalp, hair and face from dried blood. Pt assisted to ambulate to bathroom to urinate. Pt did not report dizziness or nausea. Pt back in bed, clean sheets and warm blanket at this time.

## 2017-01-26 NOTE — ED Notes (Signed)
Patient transported to CT 

## 2017-01-26 NOTE — ED Notes (Signed)
Pt given crackers and drink. Abrasion to right forearm cleaned and gauze and bandaid applied. Pt offered steri strip, states that she is allergic and does not want them.

## 2017-01-26 NOTE — ED Triage Notes (Signed)
Pt arrives to ER via [facility] after syncope and fall this AM. Pt stood up from bathroom and felt nauseated, hot and flushed and then she fell to ground. Pt does not remember falling, but awoke on bathroom floor. Laceration approx 1 inch to right side of forehead. PT takes ASA but not other blood thinners. Pt alert and oriented at this time X4.

## 2017-01-26 NOTE — ED Provider Notes (Signed)
[facility] Emergency Department Provider Note  ____________________________________________   First MD Initiated Contact with Patient 01/26/17 [phone]     (approximate)  I have reviewed the triage vital signs and the nursing notes.   HISTORY  Chief Complaint Loss of Consciousness and Fall    HPI Tammy Mcdaniel is a 83 y.o. female who comes to the emergency department via EMS after a syncopal episode at home. She lives alone and woke up at 5 AM to go to the bathroom. She sat down on the toilet went to the bathroom and then stood up to try to put herpants on and she began to feel intensely nauseated and the next thing she knew she woke up on the ground covered in blood. She takes a daily aspirin and has a history of hypertension. She has no history of arrhythmia. EMS noted that the patient's rhythm and rate was atrial fibrillation.   Past Medical History:  Diagnosis Date  . Cancer ([place])   . Colitis, ulcerative ([place])   . Diverticulitis   . Hypertension    Diet controlled  . Kidney stone   . Rosacea   . Sciatica     Patient Active Problem List   Diagnosis Date Noted  . Syncope 01/26/2017    Past Surgical History:  Procedure Laterality Date  . ABDOMINAL HYSTERECTOMY    . BREAST SURGERY Left   . EYE SURGERY     Lens implant  . MASTECTOMY     Left and Right    Prior to Admission medications   Medication Sig Start Date End Date Taking? Authorizing Provider  amLODipine (NORVASC) 5 MG tablet Take 1 tablet (5 mg total) by mouth daily. 10/30/16 10/30/17 Yes Juline Patch, MD  aspirin EC 81 MG tablet Take 1 tablet (81 mg total) by mouth daily. 10/30/16 10/30/17 Yes Juline Patch, MD  calcium carbonate (OS-CAL) 600 MG TABS tablet Take 600 mg by mouth 3 (three) times daily with meals.   Yes [provider]  Lactobacillus (PROBIOTIC ACIDOPHILUS PO) Take 1 tablet by mouth 3 (three) times daily.   Yes [provider]  Multiple  Vitamins-Minerals (PRESERVISION AREDS 2 PO) Take 1 tablet by mouth 2 (two) times daily.   Yes [provider]    Allergies Cephalexin; Ciprofloxacin; Flagyl [metronidazole]; Gabapentin; Gluten meal; Lisinopril; Naproxen; Other; Oxycodone; Prevacid [lansoprazole]; Penicillins; Pseudoephedrine; Rogaine  [minoxidil]; Sulfa antibiotics; and Tramadol  Family History  Problem Relation Age of Onset  . Cancer Mother   . Cancer Sister   . Cancer Sister     Social History Social History  Substance Use Topics  . Smoking status: Never Smoker  . Smokeless tobacco: Never Used  . Alcohol use No    Review of Systems Constitutional: No fever/chills Eyes: No visual changes. ENT: No sore throat. Cardiovascular: Denies chest pain. Respiratory: Denies shortness of breath. Gastrointestinal: No abdominal pain.  No nausea, no vomiting.  No diarrhea.  No constipation. Genitourinary: Negative for dysuria. Musculoskeletal: Negative for back pain. Skin: Positive for wound Neurological: Positive for headache   ____________________________________________   PHYSICAL EXAM:  VITAL SIGNS: ED Triage Vitals  Enc Vitals Group     BP 01/26/17 0753 138/85     Pulse Rate 01/26/17 0753 70     Resp 01/26/17 0753 18     Temp 01/26/17 0753 97.9 F (36.6 C)     Temp Source 01/26/17 0753 Oral     SpO2 01/26/17 0753 100 %  Weight 01/26/17 0754 120 lb (54.4 kg)     Height 01/26/17 0754 5\' 2"  (1.575 m)     Head Circumference --      Peak Flow --      Pain Score 01/26/17 0752 4     Pain Loc --      Pain Edu? --      Excl. in [place]? --     Constitutional: Alert and oriented x 4 well appearing nontoxic no diaphoresis speaks in full, clear sentences Eyes: PERRL EOMI. Head: 5cm laceration to right brow. Nose: No congestion/rhinnorhea. Mouth/Throat: No trismus Neck: No stridor.   Cardiovascular: Irregularly irregular normal rate Respiratory: Normal respiratory effort.  No retractions. Lungs CTAB  and moving good air Gastrointestinal: Soft nontender Musculoskeletal: No lower extremity edema   Neurologic:  Normal speech and language. No gross focal neurologic deficits are appreciated. Skin:  Wound to right face as above Psychiatric: Mood and affect are normal. Speech and behavior are normal.    ____________________________________________   DIFFERENTIAL  Cardiogenic syncope, laceration, intracerebral hemorrhage ____________________________________________   LABS (all labs ordered are listed, but only abnormal results are displayed)  Labs Reviewed  BASIC METABOLIC PANEL - Abnormal; Notable for the following:       Result Value   Glucose, Bld 124 (*)    GFR calc non Af Amer 48 (*)    GFR calc Af Amer 56 (*)    All other components within normal limits  HEPATIC FUNCTION PANEL - Abnormal; Notable for the following:    Bilirubin, Direct <0.1 (*)    All other components within normal limits  BRAIN NATRIURETIC PEPTIDE - Abnormal; Notable for the following:    B Natriuretic Peptide 203.0 (*)    All other components within normal limits  CBC WITH DIFFERENTIAL/PLATELET - Abnormal; Notable for the following:    HCT 48.6 (*)    Neutro Abs 7.5 (*)    All other components within normal limits  TROPONIN I  PROTIME-INR  LIPASE, BLOOD  GLUCOSE, CAPILLARY  TROPONIN I  TROPONIN I  TROPONIN I  CBG MONITORING, ED    No signs of acute ischemia __________________________________________  EKG  ED ECG REPORT I, Darel Hong, the attending physician, personally viewed and interpreted this ECG.  Date: 01/26/2017 Rate: 68 Rhythm: Atrial fibrillation QRS Axis: normal Intervals: normal ST/T Wave abnormalities: normal Conduction Disturbances: none Narrative Interpretation: Abnormal  ____________________________________________  RADIOLOGY  Head CT and neck CT with no acute disease ____________________________________________   PROCEDURES  Procedure(s) performed:  yes  LACERATION REPAIR Performed by: Darel Hong Authorized by: Darel Hong Consent: Verbal consent obtained. Risks and benefits: risks, benefits and alternatives were discussed Consent given by: patient Patient identity confirmed: provided demographic data Prepped and Draped in normal sterile fashion Wound explored  Laceration Location: Right face  Laceration Length: 5 cm  No Foreign Bodies seen or palpated  Anesthesia: local infiltration  Local anesthetic: lidocaine 1 % with epinephrine  Anesthetic total: 5 ml  Irrigation method: syringe Amount of cleaning: standard  Skin closure: Simple M to sutures   Number of sutures: Total of 8   Technique: A total of 1 deep stitch placed in a total of 7 superficial stitches.   Patient tolerance: Patient tolerated the procedure well with no immediate complications.   Procedures  Critical Care performed: no  Observation: no ____________________________________________   INITIAL IMPRESSION / ASSESSMENT AND PLAN / ED COURSE  Pertinent labs & imaging results that were available during my care of the  patient were reviewed by me and considered in my medical decision making (see chart for details).  The patient arrives with obvious facial trauma after a concerning syncopal event. Head CT and C-spine CT are fortunately negative for acute pathology. I repaired her complex wound after washing out with copious amounts of normal saline and noting no foreign body and no galeal involvement. She is currently in atrial fibrillation which is never been in before which is concerning for cardiogenic cause for syncope and she requires inpatient admission.      ____________________________________________   FINAL CLINICAL IMPRESSION(S) / ED DIAGNOSES  Final diagnoses:  Syncope, unspecified syncope type  Atrial fibrillation, unspecified type ([place])  Facial laceration, initial encounter      NEW MEDICATIONS STARTED DURING THIS  VISIT:  Current Discharge Medication List       Note:  This document was prepared using Dragon voice recognition software and may include unintentional dictation errors.     Darel Hong, MD 01/26/17 [phone]

## 2017-01-26 NOTE — Progress Notes (Signed)
Touched base with Dr. Jannifer Franklin on the results of pt's BNP & magnesium. BNP now down from 203 to 188 & magnesium is 1.9. No note in from cardiology, so will just continue to monitor since pt is asymptomatic. Tammy Simmonds, RN, BSN

## 2017-01-26 NOTE — H&P (Signed)
[place] [facility] [place] NAME: Tammy Mcdaniel    MR#:  [id number]  DATE OF BIRTH:  September 12, 1921  DATE OF ADMISSION:  01/26/2017  PRIMARY CARE PHYSICIAN: Juline Patch, MD   REQUESTING/REFERRING PHYSICIAN: Dr. Arturo Morton  CHIEF COMPLAINT:   Chief Complaint  Patient presents with  . Loss of Consciousness  . Fall    HISTORY OF PRESENT ILLNESS:  Tammy Mcdaniel  is a 83 y.o. female with a known history of Hypertension, rosacea, sciatica, history of ulcerative colitis, diverticulitis who presents to the hospital after a syncopal episode and a fall. Patient was in the bathroom and attempts to get up and then got dizzy and lightheaded and fell to the ground. She was on the ground for about a few minutes that she can recall and then walked herself to the living room and called her daughter went and called EMS to bring her to the hospital. Patient does state that she has been feeling intermittently dizzy over the past few days. She denies any chest pains, shortness of breath, fever or chills cough or any other associated symptoms. She presented to the hospital and was noted to be in new onset atrial fibrillation but rate controlled. Hospitalist services were contacted further treatment and evaluation.  PAST MEDICAL HISTORY:   Past Medical History:  Diagnosis Date  . Cancer ([place])   . Colitis, ulcerative ([place])   . Diverticulitis   . Hypertension    Diet controlled  . Kidney stone   . Rosacea   . Sciatica     PAST SURGICAL HISTORY:   Past Surgical History:  Procedure Laterality Date  . ABDOMINAL HYSTERECTOMY    . BREAST SURGERY Left   . EYE SURGERY     Lens implant  . MASTECTOMY     Left and Right    SOCIAL HISTORY:   Social History  Substance Use Topics  . Smoking status: Never Smoker  . Smokeless tobacco: Never Used  . Alcohol use No    FAMILY HISTORY:   Family History  Problem Relation Age of Onset  . Cancer Mother   . Cancer  Sister   . Cancer Sister     DRUG ALLERGIES:   Allergies  Allergen Reactions  . Cephalexin Diarrhea  . Ciprofloxacin Diarrhea and Other (See Comments)    Internal bleeding  . Flagyl [Metronidazole] Diarrhea  . Gabapentin Other (See Comments)    Dizziness  . Gluten Meal Other (See Comments)    Other Reaction: GI Upset  . Lisinopril Diarrhea    Severe diarrhea  . Naproxen Other (See Comments)    RECTAL BLEEDING  . Other Hives  . Oxycodone Other (See Comments)    Constipation   . Prevacid [Lansoprazole] Diarrhea  . Penicillins Rash  . Pseudoephedrine Rash  . Rogaine  [Minoxidil] Rash  . Sulfa Antibiotics Rash  . Tramadol Rash    REVIEW OF SYSTEMS:   Review of Systems  Constitutional: Negative for fever and weight loss.  HENT: Negative for congestion, nosebleeds and tinnitus.   Eyes: Negative for blurred vision, double vision and redness.  Respiratory: Negative for cough, hemoptysis and shortness of breath.   Cardiovascular: Negative for chest pain, orthopnea, leg swelling and PND.  Gastrointestinal: Negative for abdominal pain, diarrhea, melena, nausea and vomiting.  Genitourinary: Negative for dysuria, hematuria and urgency.  Musculoskeletal: Negative for falls and joint pain.  Neurological: Positive for dizziness. Negative for tingling, sensory change, focal weakness, seizures, weakness and headaches.  Endo/Heme/Allergies: Negative for polydipsia. Does not bruise/bleed easily.  Psychiatric/Behavioral: Negative for depression and memory loss. The patient is not nervous/anxious.     MEDICATIONS AT HOME:   Prior to Admission medications   Medication Sig Start Date End Date Taking? Authorizing Provider  amLODipine (NORVASC) 5 MG tablet Take 1 tablet (5 mg total) by mouth daily. 10/30/16 10/30/17 Yes Juline Patch, MD  aspirin EC 81 MG tablet Take 1 tablet (81 mg total) by mouth daily. 10/30/16 10/30/17 Yes Juline Patch, MD  calcium carbonate (OS-CAL) 600 MG TABS  tablet Take 600 mg by mouth 3 (three) times daily with meals.   Yes [provider]  Lactobacillus (PROBIOTIC ACIDOPHILUS PO) Take 1 tablet by mouth 3 (three) times daily.   Yes [provider]  Multiple Vitamins-Minerals (PRESERVISION AREDS 2 PO) Take 1 tablet by mouth 2 (two) times daily.   Yes [provider]      VITAL SIGNS:  Blood pressure 138/85, pulse 68, temperature 97.9 F (36.6 C), temperature source Oral, resp. rate 20, height 5\' 2"  (1.575 m), weight 54.4 kg (120 lb), SpO2 96 %.  PHYSICAL EXAMINATION:  Physical Exam  GENERAL:  83 y.o.-year-old patient lying in bed with in no acute distress.  EYES: Pupils equal, round, reactive to light and accommodation. No scleral icterus. Extraocular muscles intact.  HEENT: normocephalic, + laceration on right side of forehead. Oropharynx and nasopharynx clear. No oropharyngeal erythema, moist oral mucosa  NECK:  Supple, no jugular venous distention. No thyroid enlargement, no tenderness.  LUNGS: Normal breath sounds bilaterally, no wheezing, rales, rhonchi. No use of accessory muscles of respiration.  CARDIOVASCULAR: S1, S2 RRR. No murmurs, rubs, gallops, clicks.  ABDOMEN: Soft, nontender, nondistended. Bowel sounds present. No organomegaly or mass.  EXTREMITIES: No pedal edema, cyanosis, or clubbing. + 2 pedal & radial pulses b/l.   NEUROLOGIC: Cranial nerves II through XII are intact. No focal Motor or sensory deficits appreciated b/l PSYCHIATRIC: The patient is alert and oriented x 3.  SKIN: No obvious rash, lesion, or ulcer.   LABORATORY PANEL:   CBC  Recent Labs Lab 01/26/17 0756  WBC 9.1  HGB 15.9  HCT 48.6*  PLT 267   ------------------------------------------------------------------------------------------------------------------  Chemistries   Recent Labs Lab 01/26/17 0756  NA 139  K 4.8  CL 104  CO2 28  GLUCOSE 124*  BUN 18  CREATININE 0.99  CALCIUM 9.5  AST 26  ALT 16  ALKPHOS  101  BILITOT 0.6   ------------------------------------------------------------------------------------------------------------------  Cardiac Enzymes  Recent Labs Lab 01/26/17 0756  TROPONINI <0.03   ------------------------------------------------------------------------------------------------------------------  RADIOLOGY:  Dg Chest 1 View  Result Date: 01/26/2017 CLINICAL DATA:  Fall this morning with concussion. Initial encounter. EXAM: CHEST 1 VIEW COMPARISON:  03/03/2016 FINDINGS: Borderline heart size. Mild aortic tortuosity. Negative hila. There is no edema, consolidation, effusion, or pneumothorax. Unexpected soft tissue shadows over the bilateral chest from bilateral mastectomy. No evidence of fracture. IMPRESSION: No acute finding. Electronically Signed   By: Monte Fantasia M.D.   On: 01/26/2017 08:15   Ct Head Wo Contrast  Result Date: 01/26/2017 CLINICAL DATA:  Syncope and fall this morning after the patient stood up in the bathroom and felt nauseated. Laceration on the right side of the head. Initial encounter. EXAM: CT HEAD WITHOUT CONTRAST CT CERVICAL SPINE WITHOUT CONTRAST TECHNIQUE: Multidetector CT imaging of the head and cervical spine was performed following the standard protocol without intravenous contrast. Multiplanar CT image reconstructions of the cervical spine were also  generated. COMPARISON:  Head and cervical spine CT scans 07/09/2016. FINDINGS: CT HEAD FINDINGS Brain: There is cortical atrophy and chronic microvascular ischemic change. Two small calcified meningiomas over the left cerebral convexities measuring 0.8 and 0.4 cm are unchanged. No evidence of acute abnormality including hemorrhage, infarct, midline shift, mass effect or abnormal extra-axial fluid collection is identified. No hydrocephalus or pneumocephalus. Vascular: Atherosclerosis noted. Skull: Intact. Sinuses/Orbits: Negative. Other: Laceration above the right eye is noted. CT CERVICAL SPINE  FINDINGS Alignment: 0.3 cm degenerative anterolisthesis C3 on C4 is unchanged. Skull base and vertebrae: Negative. Soft tissues and spinal canal: No prevertebral fluid or swelling. No visible canal hematoma. Disc levels: Severe multilevel loss of disc space height with endplate spurring appears unchanged. Upper chest: Lung apices are clear. Other: None. IMPRESSION: Laceration above the right eye. No other acute abnormality head or cervical spine. Atrophy and chronic microvascular ischemic change. Two small meningiomas over the left cerebral convexities are stable in appearance. Atherosclerosis. 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 Electronically Signed   By: Inge Rise M.D.   On: 01/26/2017 08:37   Ct Cervical Spine Wo Contrast  Result Date: 01/26/2017 CLINICAL DATA:  Syncope and fall this morning after the patient stood up in the bathroom and felt nauseated. Laceration on the right side of the head. Initial encounter. EXAM: CT HEAD WITHOUT CONTRAST CT CERVICAL SPINE WITHOUT CONTRAST TECHNIQUE: Multidetector CT imaging of the head and cervical spine was performed following the standard protocol without intravenous contrast. Multiplanar CT image reconstructions of the cervical spine were also generated. COMPARISON:  Head and cervical spine CT scans 07/09/2016. FINDINGS: CT HEAD FINDINGS Brain: There is cortical atrophy and chronic microvascular ischemic change. Two small calcified meningiomas over the left cerebral convexities measuring 0.8 and 0.4 cm are unchanged. No evidence of acute abnormality including hemorrhage, infarct, midline shift, mass effect or abnormal extra-axial fluid collection is identified. No hydrocephalus or pneumocephalus. Vascular: Atherosclerosis noted. Skull: Intact. Sinuses/Orbits: Negative. Other: Laceration above the right eye is noted. CT CERVICAL SPINE FINDINGS Alignment: 0.3 cm degenerative anterolisthesis C3 on C4 is unchanged. Skull base and  vertebrae: Negative. Soft tissues and spinal canal: No prevertebral fluid or swelling. No visible canal hematoma. Disc levels: Severe multilevel loss of disc space height with endplate spurring appears unchanged. Upper chest: Lung apices are clear. Other: None. IMPRESSION: Laceration above the right eye. No other acute abnormality head or cervical spine. Atrophy and chronic microvascular ischemic change. Two small meningiomas over the left cerebral convexities are stable in appearance. Atherosclerosis. 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 Electronically Signed   By: Inge Rise M.D.   On: 01/26/2017 08:37     IMPRESSION AND PLAN:   83 year old female with past medical history of hypertension, ulcerative colitis, nephrolithiasis, sciatica, hypertension, previous history of TIA presented to the hospital due to a syncopal episode.  1. Syncope-etiology unclear presently. Suspected to be vasovagal or related to orthostasis. -CT head and cervical spine negative for acute pathology. We'll get orthostatic vital signs, observe on telemetry. Cycle cardiac markers. -Check a two-dimensional echocardiogram, carotid duplex.  2. New onset atrial fibrillation-rate controlled. Unclear if this is the cause of patient's syncope. -We'll get a two-dimensional echocardiogram, get a cardiology consult. Cycle her cardiac markers.  3. Essential hypertension-hold amlodipine as suspicion for orthostasis. Check orthostatic vital signs and adjust antihypertensives accordingly.  4. History of previous TIAs-continue aspirin.  5. Osteoporosis-continue calcium supplements.    All the records are reviewed and case discussed  with ED provider. Management plans discussed with the patient, family and they are in agreement.  CODE STATUS: Full  TOTAL TIME TAKING CARE OF THIS PATIENT: 45 minutes.    Henreitta Leber M.D on 01/26/2017 at 10:24 AM  Between 7am to 6pm - Pager - [phone]  After  6pm go to [email or web address] - password [email or web address] [facility]  Office  [phone]  CC: Primary care physician; Juline Patch, MD

## 2017-01-26 NOTE — ED Notes (Signed)
Pt awoke at approx 5AM and went to bathroom. PT stood up from toilet and felt nauseated, then fell. Pt does not remember events after nausea and fall. Pt remembers awakening on bathroom floor. Pt was able to get back to her bed and call 911. Pt cardiac rhythm in afib with EMS and upon ED arrival. Pt denies hx of afib. PT takes ASA daily but denies any other blood thinners. Pt alert and oriented X4, active, cooperative, pt in NAD. RR even and unlabored, color WNL.  Pt lives alone and does her ADL's unassisted. Uses cane occasionally. No CP or SOB. Pt denies nausea at this time.

## 2017-01-27 ENCOUNTER — Observation Stay
Admit: 2017-01-27 | Discharge: 2017-01-27 | Disposition: A | Discharge status: Home / Self Care | Payer: Medicare Other | Attending: Specialist | Admitting: Specialist

## 2017-01-27 ENCOUNTER — Ambulatory Visit: Payer: Self-pay | Admitting: Family Medicine

## 2017-01-27 DIAGNOSIS — M810 Age-related osteoporosis without current pathological fracture: Secondary | ICD-10-CM | POA: Diagnosis not present

## 2017-01-27 DIAGNOSIS — I4891 Unspecified atrial fibrillation: Secondary | ICD-10-CM | POA: Diagnosis not present

## 2017-01-27 DIAGNOSIS — I951 Orthostatic hypotension: Secondary | ICD-10-CM | POA: Diagnosis not present

## 2017-01-27 DIAGNOSIS — R55 Syncope and collapse: Secondary | ICD-10-CM | POA: Diagnosis not present

## 2017-01-27 LAB — CBC
HEMATOCRIT: 41.6 % (ref 35.0–47.0)
Hemoglobin: 14 g/dL (ref 12.0–16.0)
MCH: 30.5 pg (ref 26.0–34.0)
MCHC: 33.6 g/dL (ref 32.0–36.0)
MCV: 90.9 fL (ref 80.0–100.0)
Platelets: 238 10*3/uL (ref 150–440)
RBC: 4.58 MIL/uL (ref 3.80–5.20)
RDW: 14.1 % (ref 11.5–14.5)
WBC: 6.6 10*3/uL (ref 3.6–11.0)

## 2017-01-27 LAB — BASIC METABOLIC PANEL
Anion gap: 5 (ref 5–15)
BUN: 28 mg/dL — AB (ref 6–20)
CALCIUM: 9 mg/dL (ref 8.9–10.3)
CO2: 27 mmol/L (ref 22–32)
CREATININE: 1.03 mg/dL — AB (ref 0.44–1.00)
Chloride: 108 mmol/L (ref 101–111)
GFR calc Af Amer: 53 mL/min — ABNORMAL LOW (ref >60)
GFR, EST NON AFRICAN AMERICAN: 46 mL/min — AB (ref >60)
GLUCOSE: 98 mg/dL (ref 65–99)
Potassium: 3.7 mmol/L (ref 3.5–5.1)
Sodium: 140 mmol/L (ref 135–145)

## 2017-01-27 MED ORDER — SODIUM CHLORIDE 0.9 % IV SOLN: INTRAVENOUS | Status: DC

## 2017-01-27 MED ORDER — MIDODRINE HCL 5 MG PO TABS
5.0000 mg | ORAL_TABLET | Freq: Three times a day (TID) | ORAL | Status: DC
  Administered 2017-01-27 – 2017-01-28 (×4): 5 mg via ORAL
  Filled 2017-01-27 (×4): qty 1

## 2017-01-27 MED ORDER — APIXABAN 2.5 MG PO TABS
2.5000 mg | ORAL_TABLET | Freq: Two times a day (BID) | ORAL | Status: DC
  Administered 2017-01-27 – 2017-01-28 (×2): 2.5 mg via ORAL
  Filled 2017-01-27 (×2): qty 1

## 2017-01-27 MED ORDER — SODIUM CHLORIDE 0.9 % IV BOLUS (SEPSIS)
1000.0000 mL | Freq: Once | INTRAVENOUS | Status: AC
  Administered 2017-01-27: 1000 mL via INTRAVENOUS

## 2017-01-27 NOTE — Progress Notes (Signed)
ANTICOAGULATION CONSULT NOTE - Initial Consult  Pharmacy Consult for apixaban Indication: atrial fibrillation  Allergies  Allergen Reactions  . Cephalexin Diarrhea  . Ciprofloxacin Diarrhea and Other (See Comments)    Internal bleeding  . Flagyl [Metronidazole] Diarrhea  . Gabapentin Other (See Comments)    Dizziness  . Gluten Meal Other (See Comments)    Other Reaction: GI Upset  . Lisinopril Diarrhea    Severe diarrhea  . Naproxen Other (See Comments)    RECTAL BLEEDING  . Other Hives  . Oxycodone Other (See Comments)    Constipation   . Prevacid [Lansoprazole] Diarrhea  . Penicillins Rash  . Pseudoephedrine Rash  . Rogaine  [Minoxidil] Rash  . Sulfa Antibiotics Rash  . Tramadol Rash    Patient Measurements: Height: 5\' 2"  (157.5 cm) Weight: 120 lb (54.4 kg) IBW/kg (Calculated) : 50.1  Vital Signs: Temp: 98.5 F (36.9 C) (05/23 1308) Temp Source: Oral (05/23 1308) BP: 115/61 (05/23 1311) Pulse Rate: 83 (05/23 1311)  Labs:  Recent Labs  01/26/17 0756 01/26/17 1228 01/26/17 1559 01/26/17 2020 01/27/17 0530  HGB 15.9  --   --   --  14.0  HCT 48.6*  --   --   --  41.6  PLT 267  --   --   --  238  LABPROT 12.1  --   --   --   --   INR 0.90  --   --   --   --   CREATININE 0.99  --   --   --  1.03*  TROPONINI <0.03 <0.03 <0.03 <0.03  --     Estimated Creatinine Clearance: 27.6 mL/min (A) (by C-G formula based on SCr of 1.03 mg/dL (H)).   Medical History: Past Medical History:  Diagnosis Date  . Cancer ([place])   . Colitis, ulcerative ([place])   . Diverticulitis   . Hypertension    Diet controlled  . Kidney stone   . Rosacea   . Sciatica    Assessment: Pharmacy consulted to dose and monitor apixaban in this 83 year old female with atrial fibrillation.   Plan:  Discontinue enoxaparin Order apixaban 2.5 mg PO BID and monitor CBC/SCr at least every 72 hours  Lenis Noon, PharmD Clinical Pharmacist 01/27/2017,3:06 PM

## 2017-01-27 NOTE — Care Management Note (Signed)
Case Management Note  Patient Details  Name: Tammy Mcdaniel MRN: [id number] Date of Birth: [birth year]  Subjective/Objective:                 Placed in observation for syncope.  Found to be in what appears new onset atrial fib with controlled rate.  Independent in all adls, denies issues accessing medical care, obtaining medications or with transportation.  Current with her PCP. Has access to a cane . Cardiology consult is pending    Action/Plan:   Expected Discharge Date:                  Expected Discharge Plan:     In-House Referral:     Discharge planning Services     Post Acute Care Choice:    Choice offered to:     DME Arranged:    DME Agency:     HH Arranged:    HH Agency:     Status of Service:     If discussed at [employer], dates discussed:    Additional Comments:  Katrina Stack, RN 01/27/2017, 9:16 AM

## 2017-01-27 NOTE — Plan of Care (Signed)
Problem: Pain Managment: Goal: General experience of comfort will improve Outcome: Progressing Pt treated for headache once with tylenol which gave relief. Will continue to monitor.  Problem: Tissue Perfusion: Goal: Risk factors for ineffective tissue perfusion will decrease Outcome: Progressing lovenox for VTE.  Problem: Activity: Goal: Risk for activity intolerance will decrease Outcome: Progressing Orthostatic vitals are still positive, pt using BSC with 1 assist.

## 2017-01-27 NOTE — Progress Notes (Signed)
[facility] [place] NAME: Tammy Mcdaniel    MR#:  [id number]  DATE OF BIRTH:  05-01-1922  SUBJECTIVE:   Pt. Here due to syncope and new onset a. Fib. Noted to orthostatic But asymptomatic. Also had periods of bradycardia this morning. No other complaints presently.  REVIEW OF SYSTEMS:    Review of Systems  Constitutional: Negative for chills and fever.  HENT: Negative for congestion and tinnitus.   Eyes: Negative for blurred vision and double vision.  Respiratory: Negative for cough, shortness of breath and wheezing.   Cardiovascular: Negative for chest pain, orthopnea and PND.  Gastrointestinal: Negative for abdominal pain, diarrhea, nausea and vomiting.  Genitourinary: Negative for dysuria and hematuria.  Neurological: Negative for dizziness, sensory change and focal weakness.  All other systems reviewed and are negative.   Nutrition: heart healthy Tolerating Diet: Yes Tolerating PT: Ambulatory  DRUG ALLERGIES:   Allergies  Allergen Reactions  . Cephalexin Diarrhea  . Ciprofloxacin Diarrhea and Other (See Comments)    Internal bleeding  . Flagyl [Metronidazole] Diarrhea  . Gabapentin Other (See Comments)    Dizziness  . Gluten Meal Other (See Comments)    Other Reaction: GI Upset  . Lisinopril Diarrhea    Severe diarrhea  . Naproxen Other (See Comments)    RECTAL BLEEDING  . Other Hives  . Oxycodone Other (See Comments)    Constipation   . Prevacid [Lansoprazole] Diarrhea  . Penicillins Rash  . Pseudoephedrine Rash  . Rogaine  [Minoxidil] Rash  . Sulfa Antibiotics Rash  . Tramadol Rash    VITALS:  Blood pressure 115/61, pulse 83, temperature 98.5 F (36.9 C), temperature source Oral, resp. rate 16, height 5\' 2"  (1.575 m), weight 54.4 kg (120 lb), SpO2 99 %.  PHYSICAL EXAMINATION:   Physical Exam  GENERAL:  83 y.o.-year-old patient lying in bed in no acute distress.  EYES: Pupils equal, round, reactive to light  and accommodation. No scleral icterus. Extraocular muscles intact.  HEENT: Head atraumatic, normocephalic. Oropharynx and nasopharynx clear.  Laceration on left forehead with sutures in place.  NECK:  Supple, no jugular venous distention. No thyroid enlargement, no tenderness.  LUNGS: Normal breath sounds bilaterally, no wheezing, rales, rhonchi. No use of accessory muscles of respiration.  CARDIOVASCULAR: S1, S2 normal. No murmurs, rubs, or gallops.  ABDOMEN: Soft, nontender, nondistended. Bowel sounds present. No organomegaly or mass.  EXTREMITIES: No cyanosis, clubbing or edema b/l.    NEUROLOGIC: Cranial nerves II through XII are intact. No focal Motor or sensory deficits b/l.   PSYCHIATRIC: The patient is alert and oriented x 3.  SKIN: No obvious rash, lesion, or ulcer.    LABORATORY PANEL:   CBC  Recent Labs Lab 01/27/17 0530  WBC 6.6  HGB 14.0  HCT 41.6  PLT 238   ------------------------------------------------------------------------------------------------------------------  Chemistries   Recent Labs Lab 01/26/17 0756 01/26/17 2140 01/27/17 0530  NA 139  --  140  K 4.8  --  3.7  CL 104  --  108  CO2 28  --  27  GLUCOSE 124*  --  98  BUN 18  --  28*  CREATININE 0.99  --  1.03*  CALCIUM 9.5  --  9.0  MG  --  1.9  --   AST 26  --   --   ALT 16  --   --   ALKPHOS 101  --   --   BILITOT 0.6  --   --    ------------------------------------------------------------------------------------------------------------------  Cardiac Enzymes  Recent Labs Lab 01/26/17 2020  TROPONINI <0.03   ------------------------------------------------------------------------------------------------------------------  RADIOLOGY:  Dg Chest 1 View  Result Date: 01/26/2017 CLINICAL DATA:  Fall this morning with concussion. Initial encounter. EXAM: CHEST 1 VIEW COMPARISON:  03/03/2016 FINDINGS: Borderline heart size. Mild aortic tortuosity. Negative hila. There is no edema,  consolidation, effusion, or pneumothorax. Unexpected soft tissue shadows over the bilateral chest from bilateral mastectomy. No evidence of fracture. IMPRESSION: No acute finding. Electronically Signed   By: Monte Fantasia M.D.   On: 01/26/2017 08:15   Ct Head Wo Contrast  Result Date: 01/26/2017 CLINICAL DATA:  Syncope and fall this morning after the patient stood up in the bathroom and felt nauseated. Laceration on the right side of the head. Initial encounter. EXAM: CT HEAD WITHOUT CONTRAST CT CERVICAL SPINE WITHOUT CONTRAST TECHNIQUE: Multidetector CT imaging of the head and cervical spine was performed following the standard protocol without intravenous contrast. Multiplanar CT image reconstructions of the cervical spine were also generated. COMPARISON:  Head and cervical spine CT scans 07/09/2016. FINDINGS: CT HEAD FINDINGS Brain: There is cortical atrophy and chronic microvascular ischemic change. Two small calcified meningiomas over the left cerebral convexities measuring 0.8 and 0.4 cm are unchanged. No evidence of acute abnormality including hemorrhage, infarct, midline shift, mass effect or abnormal extra-axial fluid collection is identified. No hydrocephalus or pneumocephalus. Vascular: Atherosclerosis noted. Skull: Intact. Sinuses/Orbits: Negative. Other: Laceration above the right eye is noted. CT CERVICAL SPINE FINDINGS Alignment: 0.3 cm degenerative anterolisthesis C3 on C4 is unchanged. Skull base and vertebrae: Negative. Soft tissues and spinal canal: No prevertebral fluid or swelling. No visible canal hematoma. Disc levels: Severe multilevel loss of disc space height with endplate spurring appears unchanged. Upper chest: Lung apices are clear. Other: None. IMPRESSION: Laceration above the right eye. No other acute abnormality head or cervical spine. Atrophy and chronic microvascular ischemic change. Two small meningiomas over the left cerebral convexities are stable in appearance.  Atherosclerosis. 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 Electronically Signed   By: Inge Rise M.D.   On: 01/26/2017 08:37   Ct Cervical Spine Wo Contrast  Result Date: 01/26/2017 CLINICAL DATA:  Syncope and fall this morning after the patient stood up in the bathroom and felt nauseated. Laceration on the right side of the head. Initial encounter. EXAM: CT HEAD WITHOUT CONTRAST CT CERVICAL SPINE WITHOUT CONTRAST TECHNIQUE: Multidetector CT imaging of the head and cervical spine was performed following the standard protocol without intravenous contrast. Multiplanar CT image reconstructions of the cervical spine were also generated. COMPARISON:  Head and cervical spine CT scans 07/09/2016. FINDINGS: CT HEAD FINDINGS Brain: There is cortical atrophy and chronic microvascular ischemic change. Two small calcified meningiomas over the left cerebral convexities measuring 0.8 and 0.4 cm are unchanged. No evidence of acute abnormality including hemorrhage, infarct, midline shift, mass effect or abnormal extra-axial fluid collection is identified. No hydrocephalus or pneumocephalus. Vascular: Atherosclerosis noted. Skull: Intact. Sinuses/Orbits: Negative. Other: Laceration above the right eye is noted. CT CERVICAL SPINE FINDINGS Alignment: 0.3 cm degenerative anterolisthesis C3 on C4 is unchanged. Skull base and vertebrae: Negative. Soft tissues and spinal canal: No prevertebral fluid or swelling. No visible canal hematoma. Disc levels: Severe multilevel loss of disc space height with endplate spurring appears unchanged. Upper chest: Lung apices are clear. Other: None. IMPRESSION: Laceration above the right eye. No other acute abnormality head or cervical spine. Atrophy and chronic microvascular ischemic change. Two small meningiomas over the left cerebral convexities  are stable in appearance. Atherosclerosis. 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 Electronically Signed    By: Inge Rise M.D.   On: 01/26/2017 08:37   US Carotid Bilateral  Result Date: 01/26/2017 CLINICAL DATA:  CVA. EXAM: BILATERAL CAROTID DUPLEX ULTRASOUND TECHNIQUE: Pearline Cables scale imaging, color Doppler and duplex ultrasound were performed of bilateral carotid and vertebral arteries in the neck. COMPARISON:  CT 01/26/2017.  Ultrasound 08/14/2014. FINDINGS: Criteria: Quantification of carotid stenosis is based on velocity parameters that correlate the residual internal carotid diameter with NASCET-based stenosis levels, using the diameter of the distal internal carotid lumen as the denominator for stenosis measurement. The following velocity measurements were obtained: RIGHT ICA:  54/13 cm/sec CCA:  6 2/9 cm/sec SYSTOLIC ICA/CCA RATIO:  0.9 DIASTOLIC ICA/CCA RATIO:  1.5 ECA:  63 cm/sec LEFT ICA:  61/16 cm/sec CCA:  49/67 cm/sec SYSTOLIC ICA/CCA RATIO:  1.1 DIASTOLIC ICA/CCA RATIO:  1.6 ECA:  46 cm/sec RIGHT CAROTID ARTERY: Minimal right carotid bifurcation and proximal ICA atherosclerotic vascular plaque. No flow limiting stenosis. Similar findings noted on prior exam. RIGHT VERTEBRAL ARTERY:  Patent with antegrade flow. LEFT CAROTID ARTERY: Minimal left carotid bifurcation and proximal ICA atherosclerotic vascular plaque. No flow limiting stenosis. Similar findings on prior exam. LEFT VERTEBRAL ARTERY:  Patent antegrade flow. IMPRESSION: Minimal bilateral carotid bifurcation proximal ICA atherosclerotic vascular plaque. No flow limiting stenosis. Similar findings noted on prior exam. Degree of stenosis less than 50% bilaterally. 2. Vertebrals are patent with antegrade flow. Electronically Signed   By: Marcello Moores  Register   On: 01/26/2017 14:44     ASSESSMENT AND PLAN:   83 year old female with past medical history of hypertension, ulcerative colitis, nephrolithiasis, sciatica, hypertension, previous history of TIA presented to the hospital due to a syncopal episode.  1. Syncope-secondary to orthostasis  or even tachybradycardia episodes. -Patient is positive for orthostatic hypotension. Also noted to have some significant bradycardic events with heart rates in the 40s. -Cardiac markers 3 have been negative. Carotid duplex negative for acute pathology. Await echocardiogram results. -Discussed with cardiology and plan for 48 hour Holter upon discharge to assess for possible need for pacemaker.  2. New onset atrial fibrillation-rate controlled.  -Appreciate cardiology input, await echocardiogram results. Given the previous history of TIA and high risk for CVA we'll start patient on long-term anticoagulation with Eliquis  3. Orthostatic Hypotension - etiology unclear. Given IV fluids bolus but not improved.  - will check Cortisol, TSH. Start on Midodrine and monitor.   4. History of previous TIAs- d/c ASA and start Eliquis given paroxysmal A. Fib.   5. Osteoporosis-continue calcium supplements   All the records are reviewed and case discussed with Care Management/Social Worker. Management plans discussed with the patient, family and they are in agreement.  CODE STATUS: Full code  DVT Prophylaxis: Eliquis  TOTAL TIME TAKING CARE OF THIS PATIENT: 30 minutes.   POSSIBLE D/C IN 1-2 DAYS, DEPENDING ON CLINICAL CONDITION.   Henreitta Leber M.D on 01/27/2017 at 2:48 PM  Between 7am to 6pm - Pager - [phone]  After 6pm go to [email or web address] - Proofreader  [facility]  Office  [phone]  CC: Primary care physician; Juline Patch, MD

## 2017-01-27 NOTE — Progress Notes (Signed)
*  PRELIMINARY RESULTS* Echocardiogram 2D Echocardiogram has been performed.  Tammy Mcdaniel 01/27/2017, 4:08 PM

## 2017-01-27 NOTE — Consult Note (Addendum)
Reason for Consult:brady. AFIB syncope Referring Physician: Dr Jones , Dr Sainani Hospitalist  Tammy Mcdaniel is an 83 y.o. female.  HPI: Pt presented with syncope and was found to have AFIB.She completely passed out.patient has a history of hypertension was a Zestoretic oh ulcerative colitis diverticulitis presented to emergency room after a syncopal episode which she fell and injured her forehead. Patient was in the bathroom when she fell she felt dizzy and lightheaded and fell patient was undergoing for a few minutes she was able to get up to the room called her daughter and then eventually EMS was called. Patient denies previous syncopal episodes she's had minimal intermittent episodes of dizziness denied any chest pain shortness of breath. Patient had some episodes of palpitations or irregular heartbeat she's had a TIA before. But EKG suggested atrial fibrillation was a new diagnosis for her heart rate was controlled on no rate blocking medications  Past Medical History:  Diagnosis Date  . Cancer (HCC)   . Colitis, ulcerative (HCC)   . Diverticulitis   . Hypertension    Diet controlled  . Kidney stone   . Rosacea   . Sciatica     Past Surgical History:  Procedure Laterality Date  . ABDOMINAL HYSTERECTOMY    . BREAST SURGERY Left   . EYE SURGERY     Lens implant  . MASTECTOMY     Left and Right    Family History  Problem Relation Age of Onset  . Cancer Mother   . Cancer Sister   . Cancer Sister     Social History:  reports that she has never smoked. She has never used smokeless tobacco. She reports that she does not drink alcohol or use drugs.  Allergies:  Allergies  Allergen Reactions  . Cephalexin Diarrhea  . Ciprofloxacin Diarrhea and Other (See Comments)    Internal bleeding  . Flagyl [Metronidazole] Diarrhea  . Gabapentin Other (See Comments)    Dizziness  . Gluten Meal Other (See Comments)    Other Reaction: GI Upset  . Lisinopril Diarrhea    Severe diarrhea   . Naproxen Other (See Comments)    RECTAL BLEEDING  . Other Hives  . Oxycodone Other (See Comments)    Constipation   . Prevacid [Lansoprazole] Diarrhea  . Penicillins Rash  . Pseudoephedrine Rash  . Rogaine  [Minoxidil] Rash  . Sulfa Antibiotics Rash  . Tramadol Rash    Medications: I have reviewed the patient's current medications.  Results for orders placed or performed during the hospital encounter of 01/26/17 (from the past 48 hour(s))  Basic metabolic panel     Status: Abnormal   Collection Time: 01/26/17  7:56 AM  Result Value Ref Range   Sodium 139 135 - 145 mmol/L   Potassium 4.8 3.5 - 5.1 mmol/L   Chloride 104 101 - 111 mmol/L   CO2 28 22 - 32 mmol/L   Glucose, Bld 124 (H) 65 - 99 mg/dL   BUN 18 6 - 20 mg/dL   Creatinine, Ser 0.99 0.44 - 1.00 mg/dL   Calcium 9.5 8.9 - 10.3 mg/dL   GFR calc non Af Amer 48 (L) >60 mL/min   GFR calc Af Amer 56 (L) >60 mL/min    Comment: (NOTE) The eGFR has been calculated using the CKD EPI equation. This calculation has not been validated in all clinical situations. eGFR's persistently <60 mL/min signify possible Chronic Kidney Disease.    Anion gap 7 5 - 15  Hepatic function panel       Status: Abnormal   Collection Time: 01/26/17  7:56 AM  Result Value Ref Range   Total Protein 7.1 6.5 - 8.1 g/dL   Albumin 4.2 3.5 - 5.0 g/dL   AST 26 15 - 41 U/L   ALT 16 14 - 54 U/L   Alkaline Phosphatase 101 38 - 126 U/L   Total Bilirubin 0.6 0.3 - 1.2 mg/dL   Bilirubin, Direct <0.1 (L) 0.1 - 0.5 mg/dL   Indirect Bilirubin NOT CALCULATED 0.3 - 0.9 mg/dL  Troponin I     Status: None   Collection Time: 01/26/17  7:56 AM  Result Value Ref Range   Troponin I <0.03 <0.03 ng/mL  Brain natriuretic peptide     Status: Abnormal   Collection Time: 01/26/17  7:56 AM  Result Value Ref Range   B Natriuretic Peptide 203.0 (H) 0.0 - 100.0 pg/mL  CBC with Differential     Status: Abnormal   Collection Time: 01/26/17  7:56 AM  Result Value Ref Range    WBC 9.1 3.6 - 11.0 K/uL   RBC 5.17 3.80 - 5.20 MIL/uL   Hemoglobin 15.9 12.0 - 16.0 g/dL   HCT 48.6 (H) 35.0 - 47.0 %   MCV 94.0 80.0 - 100.0 fL   MCH 30.7 26.0 - 34.0 pg   MCHC 32.6 32.0 - 36.0 g/dL   RDW 14.2 11.5 - 14.5 %   Platelets 267 150 - 440 K/uL   Neutrophils Relative % 84 %   Neutro Abs 7.5 (H) 1.4 - 6.5 K/uL   Lymphocytes Relative 11 %   Lymphs Abs 1.0 1.0 - 3.6 K/uL   Monocytes Relative 4 %   Monocytes Absolute 0.4 0.2 - 0.9 K/uL   Eosinophils Relative 1 %   Eosinophils Absolute 0.1 0 - 0.7 K/uL   Basophils Relative 0 %   Basophils Absolute 0.0 0 - 0.1 K/uL  Protime-INR     Status: None   Collection Time: 01/26/17  7:56 AM  Result Value Ref Range   Prothrombin Time 12.1 11.4 - 15.2 seconds   INR 0.90   Lipase, blood     Status: None   Collection Time: 01/26/17  7:56 AM  Result Value Ref Range   Lipase 34 11 - 51 U/L    Comment: Performed at [facility], [address] [place], [place] [address]  Glucose, capillary     Status: None   Collection Time: 01/26/17  8:00 AM  Result Value Ref Range   Glucose-Capillary 89 65 - 99 mg/dL  Troponin I     Status: None   Collection Time: 01/26/17 12:28 PM  Result Value Ref Range   Troponin I <0.03 <0.03 ng/mL  Troponin I     Status: None   Collection Time: 01/26/17  3:59 PM  Result Value Ref Range   Troponin I <0.03 <0.03 ng/mL  Troponin I     Status: None   Collection Time: 01/26/17  8:20 PM  Result Value Ref Range   Troponin I <0.03 <0.03 ng/mL  Magnesium     Status: None   Collection Time: 01/26/17  9:40 PM  Result Value Ref Range   Magnesium 1.9 1.7 - 2.4 mg/dL  Brain natriuretic peptide     Status: Abnormal   Collection Time: 01/26/17  9:40 PM  Result Value Ref Range   B Natriuretic Peptide 188.0 (H) 0.0 - 100.0 pg/mL  Basic metabolic panel     Status: Abnormal   Collection Time: 01/27/17    5:30 AM  Result Value Ref Range   Sodium 140 135 - 145 mmol/L   Potassium 3.7 3.5 - 5.1 mmol/L   Chloride  108 101 - 111 mmol/L   CO2 27 22 - 32 mmol/L   Glucose, Bld 98 65 - 99 mg/dL   BUN 28 (H) 6 - 20 mg/dL   Creatinine, Ser 1.03 (H) 0.44 - 1.00 mg/dL   Calcium 9.0 8.9 - 10.3 mg/dL   GFR calc non Af Amer 46 (L) >60 mL/min   GFR calc Af Amer 53 (L) >60 mL/min    Comment: (NOTE) The eGFR has been calculated using the CKD EPI equation. This calculation has not been validated in all clinical situations. eGFR's persistently <60 mL/min signify possible Chronic Kidney Disease.    Anion gap 5 5 - 15  CBC     Status: None   Collection Time: 01/27/17  5:30 AM  Result Value Ref Range   WBC 6.6 3.6 - 11.0 K/uL   RBC 4.58 3.80 - 5.20 MIL/uL   Hemoglobin 14.0 12.0 - 16.0 g/dL   HCT 41.6 35.0 - 47.0 %   MCV 90.9 80.0 - 100.0 fL   MCH 30.5 26.0 - 34.0 pg   MCHC 33.6 32.0 - 36.0 g/dL   RDW 14.1 11.5 - 14.5 %   Platelets 238 150 - 440 K/uL    Dg Chest 1 View  Result Date: 01/26/2017 CLINICAL DATA:  Fall this morning with concussion. Initial encounter. EXAM: CHEST 1 VIEW COMPARISON:  03/03/2016 FINDINGS: Borderline heart size. Mild aortic tortuosity. Negative hila. There is no edema, consolidation, effusion, or pneumothorax. Unexpected soft tissue shadows over the bilateral chest from bilateral mastectomy. No evidence of fracture. IMPRESSION: No acute finding. Electronically Signed   By: Jonathon  Watts M.D.   On: 01/26/2017 08:15   Ct Head Wo Contrast  Result Date: 01/26/2017 CLINICAL DATA:  Syncope and fall this morning after the patient stood up in the bathroom and felt nauseated. Laceration on the right side of the head. Initial encounter. EXAM: CT HEAD WITHOUT CONTRAST CT CERVICAL SPINE WITHOUT CONTRAST TECHNIQUE: Multidetector CT imaging of the head and cervical spine was performed following the standard protocol without intravenous contrast. Multiplanar CT image reconstructions of the cervical spine were also generated. COMPARISON:  Head and cervical spine CT scans 07/09/2016. FINDINGS: CT HEAD  FINDINGS Brain: There is cortical atrophy and chronic microvascular ischemic change. Two small calcified meningiomas over the left cerebral convexities measuring 0.8 and 0.4 cm are unchanged. No evidence of acute abnormality including hemorrhage, infarct, midline shift, mass effect or abnormal extra-axial fluid collection is identified. No hydrocephalus or pneumocephalus. Vascular: Atherosclerosis noted. Skull: Intact. Sinuses/Orbits: Negative. Other: Laceration above the right eye is noted. CT CERVICAL SPINE FINDINGS Alignment: 0.3 cm degenerative anterolisthesis C3 on C4 is unchanged. Skull base and vertebrae: Negative. Soft tissues and spinal canal: No prevertebral fluid or swelling. No visible canal hematoma. Disc levels: Severe multilevel loss of disc space height with endplate spurring appears unchanged. Upper chest: Lung apices are clear. Other: None. IMPRESSION: Laceration above the right eye. No other acute abnormality head or cervical spine. Atrophy and chronic microvascular ischemic change. Two small meningiomas over the left cerebral convexities are stable in appearance. Atherosclerosis. 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 Electronically Signed   By: Thomas  Dalessio M.D.   On: 01/26/2017 08:37   Ct Cervical Spine Wo Contrast  Result Date: 01/26/2017 CLINICAL DATA:  Syncope and fall this morning after the patient   stood up in the bathroom and felt nauseated. Laceration on the right side of the head. Initial encounter. EXAM: CT HEAD WITHOUT CONTRAST CT CERVICAL SPINE WITHOUT CONTRAST TECHNIQUE: Multidetector CT imaging of the head and cervical spine was performed following the standard protocol without intravenous contrast. Multiplanar CT image reconstructions of the cervical spine were also generated. COMPARISON:  Head and cervical spine CT scans 07/09/2016. FINDINGS: CT HEAD FINDINGS Brain: There is cortical atrophy and chronic microvascular ischemic change. Two small  calcified meningiomas over the left cerebral convexities measuring 0.8 and 0.4 cm are unchanged. No evidence of acute abnormality including hemorrhage, infarct, midline shift, mass effect or abnormal extra-axial fluid collection is identified. No hydrocephalus or pneumocephalus. Vascular: Atherosclerosis noted. Skull: Intact. Sinuses/Orbits: Negative. Other: Laceration above the right eye is noted. CT CERVICAL SPINE FINDINGS Alignment: 0.3 cm degenerative anterolisthesis C3 on C4 is unchanged. Skull base and vertebrae: Negative. Soft tissues and spinal canal: No prevertebral fluid or swelling. No visible canal hematoma. Disc levels: Severe multilevel loss of disc space height with endplate spurring appears unchanged. Upper chest: Lung apices are clear. Other: None. IMPRESSION: Laceration above the right eye. No other acute abnormality head or cervical spine. Atrophy and chronic microvascular ischemic change. Two small meningiomas over the left cerebral convexities are stable in appearance. Atherosclerosis. 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 Electronically Signed   By: Thomas  Dalessio M.D.   On: 01/26/2017 08:37   Us Carotid Bilateral  Result Date: 01/26/2017 CLINICAL DATA:  CVA. EXAM: BILATERAL CAROTID DUPLEX ULTRASOUND TECHNIQUE: Gray scale imaging, color Doppler and duplex ultrasound were performed of bilateral carotid and vertebral arteries in the neck. COMPARISON:  CT 01/26/2017.  Ultrasound 08/14/2014. FINDINGS: Criteria: Quantification of carotid stenosis is based on velocity parameters that correlate the residual internal carotid diameter with NASCET-based stenosis levels, using the diameter of the distal internal carotid lumen as the denominator for stenosis measurement. The following velocity measurements were obtained: RIGHT ICA:  54/13 cm/sec CCA:  6 2/9 cm/sec SYSTOLIC ICA/CCA RATIO:  0.9 DIASTOLIC ICA/CCA RATIO:  1.5 ECA:  63 cm/sec LEFT ICA:  61/16 cm/sec CCA:  57/10  cm/sec SYSTOLIC ICA/CCA RATIO:  1.1 DIASTOLIC ICA/CCA RATIO:  1.6 ECA:  46 cm/sec RIGHT CAROTID ARTERY: Minimal right carotid bifurcation and proximal ICA atherosclerotic vascular plaque. No flow limiting stenosis. Similar findings noted on prior exam. RIGHT VERTEBRAL ARTERY:  Patent with antegrade flow. LEFT CAROTID ARTERY: Minimal left carotid bifurcation and proximal ICA atherosclerotic vascular plaque. No flow limiting stenosis. Similar findings on prior exam. LEFT VERTEBRAL ARTERY:  Patent antegrade flow. IMPRESSION: Minimal bilateral carotid bifurcation proximal ICA atherosclerotic vascular plaque. No flow limiting stenosis. Similar findings noted on prior exam. Degree of stenosis less than 50% bilaterally. 2. Vertebrals are patent with antegrade flow. Electronically Signed   By: Thomas  Register   On: 01/26/2017 14:44    Review of Systems  Constitutional: Positive for malaise/fatigue.  HENT: Negative.   Eyes: Negative.   Respiratory: Negative.   Cardiovascular: Positive for palpitations.  Gastrointestinal: Negative.   Genitourinary: Negative.   Musculoskeletal: Positive for myalgias.  Skin: Negative.   Neurological: Positive for dizziness, loss of consciousness and weakness.  Endo/Heme/Allergies: Negative.    Blood pressure 115/61, pulse 83, temperature 98.5 F (36.9 C), temperature source Oral, resp. rate 16, height 5' 2" (1.575 m), weight 54.4 kg (120 lb), SpO2 99 %. Physical Exam  Nursing note and vitals reviewed. Constitutional: She is oriented to person, place, and time. She appears well-developed and   well-nourished.  HENT:  Head: Normocephalic and atraumatic.  Eyes: Conjunctivae and EOM are normal. Pupils are equal, round, and reactive to light.  Neck: Normal range of motion. Neck supple.  Cardiovascular: Normal rate and regular rhythm.   Murmur heard. Respiratory: Effort normal and breath sounds normal.  GI: Soft. Bowel sounds are normal.  Musculoskeletal: Normal range of  motion.  Neurological: She is alert and oriented to person, place, and time. She has normal reflexes.  Skin: Skin is warm and dry.  Psychiatric: She has a normal mood and affect.    Assessment/Plan: AFIB controlled rate Syncope LOC Fall Bradycardia, sympyomatic Ulcerative colitis Diverticulitis HTN TIA OA . PLAN Agree with tele for brady Consider non invasive evaluation Consider functional study Agree with HTN control ,hold amlodipine Agree with Holter for 24-48hrs ECHO for afib/murmur Recommend start anticoagulation withEliquis 2.5 mg twice a day. Discontinue aspirin once Eilquis started Recommend EP evaluation for possible PPM Consider having a patient follow-up as an outpatient for possible evaluation for possible permanent pacemaker for symptomatic bradycardia as well as slow A. fib   Dwayne D Callwood 01/27/2017, 4:48 PM     

## 2017-01-28 DIAGNOSIS — I951 Orthostatic hypotension: Secondary | ICD-10-CM | POA: Diagnosis not present

## 2017-01-28 DIAGNOSIS — M810 Age-related osteoporosis without current pathological fracture: Secondary | ICD-10-CM | POA: Diagnosis not present

## 2017-01-28 DIAGNOSIS — I4891 Unspecified atrial fibrillation: Secondary | ICD-10-CM | POA: Diagnosis not present

## 2017-01-28 DIAGNOSIS — R55 Syncope and collapse: Secondary | ICD-10-CM | POA: Diagnosis not present

## 2017-01-28 LAB — ECHOCARDIOGRAM COMPLETE
HEIGHTINCHES: 62 in
WEIGHTICAEL: 1920 [oz_av]

## 2017-01-28 MED ORDER — MIDODRINE HCL 5 MG PO TABS: 5.0000 mg | ORAL_TABLET | Freq: Two times a day (BID) | ORAL | 0 refills | Status: AC

## 2017-01-28 MED ORDER — APIXABAN 2.5 MG PO TABS: 2.5000 mg | ORAL_TABLET | Freq: Two times a day (BID) | ORAL | 1 refills | Status: DC

## 2017-01-28 NOTE — Care Management (Signed)
Physical therapy order not placed until today.  Patient for discharge.  Agreeable to home health. Agency preference [facility]. Will require walker as balance "really off with her cane."  Order obtained for all

## 2017-01-28 NOTE — Care Management (Signed)
Provided patient with 30 Day free trial Eliquis.  She decided she does not want to pursue getting a walker prior to discharge. Wants home health staff to determine whether it is needed.  Notified Alvis Lemmings that order is in [employer]

## 2017-01-28 NOTE — Care Management Obs Status (Signed)
[facility] NOTIFICATION   Patient Details  Name: Tammy Mcdaniel MRN: [id number] Date of Birth: February 26, 1922   Medicare Observation Status Notification Given:  Yes    Katrina Stack, RN 01/28/2017, 12:14 PM

## 2017-01-28 NOTE — Plan of Care (Signed)
Problem: Food- and Nutrition-Related Knowledge Deficit (NB-1.1) Goal: Nutrition education Formal process to instruct or train a patient/client in a skill or to impart knowledge to help patients/clients voluntarily manage or modify food choices and eating behavior to maintain or improve health. Outcome: Adequate for Discharge Nutrition Education Note  RD consulted for nutrition education regarding a Heart Healthy diet.   Lipid Panel     Component Value Date/Time   CHOL 186 10/14/2011 0846   TRIG 121 10/14/2011 0846   HDL 69 (H) 10/14/2011 0846   VLDL 24 10/14/2011 0846   LDLCALC 93 10/14/2011 0846    RD provided "Heart Healthy Nutrition Therapy" handout from the Academy of [employer]. Reviewed patient's dietary recall. Provided examples on ways to decrease sodium and fat intake in diet. Discouraged intake of processed foods and use of salt shaker. Encouraged fresh fruits and vegetables as well as whole grain sources of carbohydrates to maximize fiber intake. Teach back method used.  Expect fair compliance.  Body mass index is 21.95 kg/m. Pt meets criteria for normal based on current BMI.  Current diet order is heart healthy, patient is consuming approximately 100% of meals at this time. Labs and medications reviewed. No further nutrition interventions warranted at this time. RD contact information provided. If additional nutrition issues arise, please re-consult RD.  Satira Anis. Benjie Ricketson, MS, RD LDN Inpatient Clinical Dietitian Pager [phone]

## 2017-01-28 NOTE — Discharge Summary (Signed)
[facility] [place] NAME: Tammy Mcdaniel    MR#:  [id number]  DATE OF BIRTH:  Feb 05, 1922  DATE OF ADMISSION:  01/26/2017 ADMITTING PHYSICIAN: Henreitta Leber, MD  DATE OF DISCHARGE: 01/28/2017  PRIMARY CARE PHYSICIAN: Juline Patch, MD    ADMISSION DIAGNOSIS:  Facial laceration, initial encounter [S01.81XA] Syncope, unspecified syncope type [R55] Atrial fibrillation, unspecified type ([place]) [I48.91]  DISCHARGE DIAGNOSIS:  Active Problems:   Syncope   SECONDARY DIAGNOSIS:   Past Medical History:  Diagnosis Date  . Cancer ([place])   . Colitis, ulcerative ([place])   . Diverticulitis   . Hypertension    Diet controlled  . Kidney stone   . Rosacea   . Sciatica     HOSPITAL COURSE:   83 year old female with past medical history of hypertension, ulcerative colitis, nephrolithiasis, sciatica, hypertension, previous history of TIA presented to the hospital due to a syncopal episode.  1. Syncope-secondary to orthostasis Symptomatic bradycardia episodes. -Patient was noted to be orthostatic, she was given IV fluids but did not respond to it. Started on some Midodrine and this improved to orthostasis. Patient also had episodes of significant bradycardia with heart rates in the high 30s to low 40s. -Cardiology was consulted, who recommended outpatient follow-up and possible need for a pacemaker which is to be determined as an outpatient. - Carotid duplex, CT head (-).  Echo showing normal LV function.    2. New onset atrial fibrillation-rate controlled.  -Appreciate cardiology input, and Echo showing normal EF with no evidence of thrombus.  Given the previous history of TIA and high risk for CVA pt. Now being discharged on Oral Eliquis  3. Orthostatic Hypotension - etiology unclear. Given IV fluids bolus but not improved.  - improved on Midodrine and pt. Being discharged on it.  Asymptomatic now.   4. History of previous TIAs- d/c ASA and  started Eliquis given paroxysmal A. Fib.   5. Osteoporosis-continue calcium supplements  D/c home later today.    DISCHARGE CONDITIONS:   Stable  CONSULTS OBTAINED:  Treatment Team:  Yolonda Kida, MD  DRUG ALLERGIES:   Allergies  Allergen Reactions  . Cephalexin Diarrhea  . Ciprofloxacin Diarrhea and Other (See Comments)    Internal bleeding  . Flagyl [Metronidazole] Diarrhea  . Gabapentin Other (See Comments)    Dizziness  . Gluten Meal Other (See Comments)    Other Reaction: GI Upset  . Lisinopril Diarrhea    Severe diarrhea  . Naproxen Other (See Comments)    RECTAL BLEEDING  . Other Hives  . Oxycodone Other (See Comments)    Constipation   . Prevacid [Lansoprazole] Diarrhea  . Penicillins Rash  . Pseudoephedrine Rash  . Rogaine  [Minoxidil] Rash  . Sulfa Antibiotics Rash  . Tramadol Rash    DISCHARGE MEDICATIONS:   Allergies as of 01/28/2017      Reactions   Cephalexin Diarrhea   Ciprofloxacin Diarrhea, Other (See Comments)   Internal bleeding   Flagyl [metronidazole] Diarrhea   Gabapentin Other (See Comments)   Dizziness   Gluten Meal Other (See Comments)   Other Reaction: GI Upset   Lisinopril Diarrhea   Severe diarrhea   Naproxen Other (See Comments)   RECTAL BLEEDING   Other Hives   Oxycodone Other (See Comments)   Constipation    Prevacid [lansoprazole] Diarrhea   Penicillins Rash   Pseudoephedrine Rash   Rogaine  [minoxidil] Rash   Sulfa Antibiotics Rash   Tramadol  Rash      Medication List    STOP taking these medications   amLODipine 5 MG tablet Commonly known as:  NORVASC     TAKE these medications   aspirin EC 81 MG tablet Take 1 tablet (81 mg total) by mouth daily.   calcium carbonate 600 MG Tabs tablet Commonly known as:  OS-CAL Take 600 mg by mouth 3 (three) times daily with meals.   midodrine 5 MG tablet Commonly known as:  PROAMATINE Take 1 tablet (5 mg total) by mouth 2 (two) times daily with a meal.    PRESERVISION AREDS 2 PO Take 1 tablet by mouth 2 (two) times daily.   PROBIOTIC ACIDOPHILUS PO Take 1 tablet by mouth 3 (three) times daily.         DISCHARGE INSTRUCTIONS:   DIET:  Cardiac diet  DISCHARGE CONDITION:  Stable  ACTIVITY:  Activity as tolerated  OXYGEN:  Home Oxygen: No.   Oxygen Delivery: room air  DISCHARGE LOCATION:  home   If you experience worsening of your admission symptoms, develop shortness of breath, life threatening emergency, suicidal or homicidal thoughts you must seek medical attention immediately by calling 911 or calling your MD immediately  if symptoms less severe.  You Must read complete instructions/literature along with all the possible adverse reactions/side effects for all the Medicines you take and that have been prescribed to you. Take any new Medicines after you have completely understood and accpet all the possible adverse reactions/side effects.   Please note  You were cared for by a hospitalist during your hospital stay. If you have any questions about your discharge medications or the care you received while you were in the hospital after you are discharged, you can call the unit and asked to speak with the hospitalist on call if the hospitalist that took care of you is not available. Once you are discharged, your primary care physician will handle any further medical issues. Please note that NO REFILLS for any discharge medications will be authorized once you are discharged, as it is imperative that you return to your primary care physician (or establish a relationship with a primary care physician if you do not have one) for your aftercare needs so that they can reassess your need for medications and monitor your lab values.     Today   No further episodes of syncope. Feels better. No dizziness, no other complaints presently.  VITAL SIGNS:  Blood pressure (!) 178/72, pulse (!) 58, temperature 97.5 F (36.4 C), temperature  source Oral, resp. rate 18, height 5\' 2"  (1.575 m), weight 54.4 kg (120 lb), SpO2 97 %.  I/O:   Intake/Output Summary (Last 24 hours) at 01/28/17 1350 Last data filed at 01/28/17 1246  Gross per 24 hour  Intake              480 ml  Output             2300 ml  Net            -1820 ml    PHYSICAL EXAMINATION:   GENERAL:  83 y.o.-year-old patient lying in bed in no acute distress.  EYES: Pupils equal, round, reactive to light and accommodation. No scleral icterus. Extraocular muscles intact.  HEENT: Head atraumatic, normocephalic. Oropharynx and nasopharynx clear.  Laceration on left forehead with sutures in place.  NECK:  Supple, no jugular venous distention. No thyroid enlargement, no tenderness.  LUNGS: Normal breath sounds bilaterally, no wheezing, rales, rhonchi.  No use of accessory muscles of respiration.  CARDIOVASCULAR: S1, S2 normal. No murmurs, rubs, or gallops.  ABDOMEN: Soft, nontender, nondistended. Bowel sounds present. No organomegaly or mass.  EXTREMITIES: No cyanosis, clubbing or edema b/l.    NEUROLOGIC: Cranial nerves II through XII are intact. No focal Motor or sensory deficits b/l.   PSYCHIATRIC: The patient is alert and oriented x 3.  SKIN: No obvious rash, lesion, or ulcer.   DATA REVIEW:   CBC  Recent Labs Lab 01/27/17 0530  WBC 6.6  HGB 14.0  HCT 41.6  PLT 238    Chemistries   Recent Labs Lab 01/26/17 0756 01/26/17 2140 01/27/17 0530  NA 139  --  140  K 4.8  --  3.7  CL 104  --  108  CO2 28  --  27  GLUCOSE 124*  --  98  BUN 18  --  28*  CREATININE 0.99  --  1.03*  CALCIUM 9.5  --  9.0  MG  --  1.9  --   AST 26  --   --   ALT 16  --   --   ALKPHOS 101  --   --   BILITOT 0.6  --   --     Cardiac Enzymes  Recent Labs Lab 01/26/17 2020  TROPONINI <0.03    Microbiology Results  Results for orders placed or performed in visit on 08/01/14  Urine culture     Status: None   Collection Time: 08/01/14  1:13 PM  Result Value Ref  Range Status   Micro Text Report   Final       SOURCE: CLEAN CATCH    COMMENT                   MIXED BACTERIAL ORGANISMS   COMMENT                   RESULTS SUGGESTIVE OF CONTAMINATION   ANTIBIOTIC                                                        RADIOLOGY:  US Carotid Bilateral  Result Date: 01/26/2017 CLINICAL DATA:  CVA. EXAM: BILATERAL CAROTID DUPLEX ULTRASOUND TECHNIQUE: Pearline Cables scale imaging, color Doppler and duplex ultrasound were performed of bilateral carotid and vertebral arteries in the neck. COMPARISON:  CT 01/26/2017.  Ultrasound 08/14/2014. FINDINGS: Criteria: Quantification of carotid stenosis is based on velocity parameters that correlate the residual internal carotid diameter with NASCET-based stenosis levels, using the diameter of the distal internal carotid lumen as the denominator for stenosis measurement. The following velocity measurements were obtained: RIGHT ICA:  54/13 cm/sec CCA:  6 2/9 cm/sec SYSTOLIC ICA/CCA RATIO:  0.9 DIASTOLIC ICA/CCA RATIO:  1.5 ECA:  63 cm/sec LEFT ICA:  61/16 cm/sec CCA:  50/53 cm/sec SYSTOLIC ICA/CCA RATIO:  1.1 DIASTOLIC ICA/CCA RATIO:  1.6 ECA:  46 cm/sec RIGHT CAROTID ARTERY: Minimal right carotid bifurcation and proximal ICA atherosclerotic vascular plaque. No flow limiting stenosis. Similar findings noted on prior exam. RIGHT VERTEBRAL ARTERY:  Patent with antegrade flow. LEFT CAROTID ARTERY: Minimal left carotid bifurcation and proximal ICA atherosclerotic vascular plaque. No flow limiting stenosis. Similar findings on prior exam. LEFT VERTEBRAL ARTERY:  Patent antegrade flow. IMPRESSION: Minimal bilateral carotid bifurcation proximal ICA atherosclerotic vascular plaque. No  flow limiting stenosis. Similar findings noted on prior exam. Degree of stenosis less than 50% bilaterally. 2. Vertebrals are patent with antegrade flow. Electronically Signed   By: Marcello Moores  Register   On: 01/26/2017 14:44      Management plans discussed with the  patient, family and they are in agreement.  CODE STATUS:     Code Status Orders        Start     Ordered   01/26/17 1155  Full code  Continuous     01/26/17 1154    Code Status History    Date Active Date Inactive Code Status Order ID Comments User Context   This patient has a current code status but no historical code status.    Advance Directive Documentation     Most Recent Value  Type of Advance Directive  Healthcare Power of Attorney, Living will  Pre-existing out of facility DNR order (yellow form or pink MOST form)  -  "MOST" Form in Place?  -      TOTAL TIME TAKING CARE OF THIS PATIENT: 40 minutes.    Henreitta Leber M.D on 01/28/2017 at 1:50 PM  Between 7am to 6pm - Pager - [phone]  After 6pm go to [email or web address] - Proofreader  [facility]  Office  [phone]  CC: Primary care physician; Juline Patch, MD

## 2017-01-28 NOTE — Evaluation (Signed)
Physical Therapy Evaluation Patient Details Name: Tammy Mcdaniel MRN: [id number] DOB: August 12, 1922 Today's Date: 01/28/2017   History of Present Illness  Tammy Mcdaniel  is a 83 y.o. female with a known history of Hypertension, rosacea, sciatica, history of ulcerative colitis, diverticulitis who presents to the hospital after a syncopal episode and a fall. Patient was in the bathroom and attempts to get up and then got dizzy and lightheaded and fell to the ground. She was on the ground for about a few minutes that she can recall and then walked herself to the living room and called her daughter went and called EMS to bring her to the hospital. Patient does state that she has been feeling intermittently dizzy over the past few days. She denies any chest pains, shortness of breath, fever or chills cough or any other associated symptoms. She presented to the hospital and was noted to be in new onset atrial fibrillation but rate controlled.   Clinical Impression  Pt demonstrates excellent strength and stability with physical therapy evaluation. She is independent with sit to stand transfers without UE support. Five time sit to stand is 11.2s demonstrating excellent strength and low fall risk. Pt able to ambulate a full lap around RN station at full community ambulation speed. Mild lateral gait deviation with horizontal and vertical head turns but no instabiltiy. No DOE with ambulation. Able to perform 180 degree turns in <3s and change gait speed without imbalance. No dizziness or lightheadedness during all transfers and ambulation. Pt able to stand in tandem and single leg balance for greater than 30 seconds. She is even able to lift her contralateral extremity up in front of her while standing on one leg. She walks 1 mile every day and does Tai Chi every morning. Pt has a fall alert system at home but it did not function when she needed it. She plans to contact the company. She has a daughter who lives 20 minutes  away and is available for help as needed. Pt plans to move into an ALF in [place] when a bed becomes available. She has no needs at this time with respect to physical therapy following discharge. If she develops further concerns related to her strength and balance she was encouraged to talk with her PCP. Order will be completed. Please enter new order if status and/or needs change.     Follow Up Recommendations No PT follow up    Equipment Recommendations  None recommended by PT    Recommendations for Other Services       Precautions / Restrictions Precautions Precautions: Fall Restrictions Weight Bearing Restrictions: No      Mobility  Bed Mobility               General bed mobility comments: Received and left upright in recliner  Transfers Overall transfer level: Independent Equipment used: None             General transfer comment: Independent sit to stand without UE support. Five time sit to stand is 11.2s. Good stability in standing  Ambulation/Gait Ambulation/Gait assistance: Independent Ambulation Distance (Feet): 200 Feet Assistive device: None Gait Pattern/deviations: WFL(Within Functional Limits) Gait velocity: WFL for full community mobility Gait velocity interpretation: >2.62 ft/sec, indicative of independent community ambulator General Gait Details: Pt able to ambulate a full lap around RN station at full community ambulation speed. Mild lateral gait deviation with horizontal and vertical head turns but no instabiltiy. No DOE with ambulation. Able to perform 180 degree turns in <  3s and change gait speed without imbalance. No dizziness or lightheadedness during all transfers and ambulation  Stairs            Wheelchair Mobility    Modified Rankin (Stroke Patients Only)       Balance Overall balance assessment: Needs assistance;Independent         Standing balance support: No upper extremity supported Standing balance-Leahy Scale:  Normal Standing balance comment: Pt able to perform full tandem and single leg balance for greater than 30 seconds. Negative [place]omberg. History of 1 fall in the last 12 months when bending over to pet a cat with the exception of current syncopal episode                             Pertinent Vitals/Pain Pain Assessment: No/denies pain    Home Living Family/patient expects to be discharged to:: Private residence Living Arrangements: Alone Available Help at Discharge: Family Type of Home: House Home Access: Stairs to enter Entrance Stairs-Rails: Right;Left;Can reach both Technical brewer of Steps: 2 Home Layout: Laundry or work area in basement;Able to live on main level with bedroom/bathroom Home Equipment: Kasandra Knudsen - single point (no walker, has fall alert system)      Prior Function Level of Independence: Independent         Comments: Drives, independent with ADLs/IADLs, has spc but doesn't use. Pt does Tai Chi every morning and walks 1 mile every day     Hand Dominance   Dominant Hand: Right    Extremity/Trunk Assessment   Upper Extremity Assessment Upper Extremity Assessment: Overall WFL for tasks assessed    Lower Extremity Assessment Lower Extremity Assessment: Overall WFL for tasks assessed       Communication   Communication: No difficulties  Cognition Arousal/Alertness: Awake/alert Behavior During Therapy: WFL for tasks assessed/performed Overall Cognitive Status: Within Functional Limits for tasks assessed                                        General Comments      Exercises     Assessment/Plan    PT Assessment Patent does not need any further PT services  PT Problem List         PT Treatment Interventions      PT Goals (Current goals can be found in the Care Plan section)  Acute Rehab PT Goals PT Goal Formulation: All assessment and education complete, DC therapy    Frequency     Barriers to discharge         Co-evaluation               AM-PAC PT "6 Clicks" Daily Activity  Outcome Measure Difficulty turning over in bed (including adjusting bedclothes, sheets and blankets)?: None Difficulty moving from lying on back to sitting on the side of the bed? : None Difficulty sitting down on and standing up from a chair with arms (e.g., wheelchair, bedside commode, etc,.)?: None Help needed moving to and from a bed to chair (including a wheelchair)?: None Help needed walking in hospital room?: None Help needed climbing 3-5 steps with a railing? : None 6 Click Score: 24    End of Session Equipment Utilized During Treatment: Gait belt Activity Tolerance: Patient tolerated treatment well Patient left: with call bell/phone within reach;Other (comment) (Packing to discharge) Nurse Communication: Mobility status  PT Visit Diagnosis: Unsteadiness on feet (R26.81)    Time: 5102-5852 PT Time Calculation (min) (ACUTE ONLY): 20 min   Charges:   PT Evaluation $PT Eval Low Complexity: 1 Procedure     PT G Codes:   PT G-Codes **NOT FOR INPATIENT CLASS** Functional Assessment Tool Used: AM-PAC 6 Clicks Basic Mobility Functional Limitation: Mobility: Walking and moving around Mobility: Walking and Moving Around Current Status (D7824): 0 percent impaired, limited or restricted Mobility: Walking and Moving Around Goal Status (M3536): 0 percent impaired, limited or restricted Mobility: Walking and Moving Around Discharge Status (R4431): 0 percent impaired, limited or restricted    Phillips Grout PT, DPT    Brynn Reznik 01/28/2017, 5:15 PM

## 2017-01-28 NOTE — Progress Notes (Signed)
Patient complaining of continous headache. Given tylenol. No relief. Offered to get new medication. Declined. Given soda to attempt to use caffeine for relief.   Patient heart rate decreased to 38. Asymptomatic. Resting in bed.

## 2017-01-29 DIAGNOSIS — S0181XD Laceration without foreign body of other part of head, subsequent encounter: Secondary | ICD-10-CM | POA: Diagnosis not present

## 2017-01-29 DIAGNOSIS — I4891 Unspecified atrial fibrillation: Secondary | ICD-10-CM | POA: Diagnosis not present

## 2017-01-30 DIAGNOSIS — I4891 Unspecified atrial fibrillation: Secondary | ICD-10-CM | POA: Diagnosis not present

## 2017-01-30 DIAGNOSIS — S0181XD Laceration without foreign body of other part of head, subsequent encounter: Secondary | ICD-10-CM | POA: Diagnosis not present

## 2017-02-02 DIAGNOSIS — S0181XD Laceration without foreign body of other part of head, subsequent encounter: Secondary | ICD-10-CM | POA: Diagnosis not present

## 2017-02-02 DIAGNOSIS — I4891 Unspecified atrial fibrillation: Secondary | ICD-10-CM | POA: Diagnosis not present

## 2017-02-04 DIAGNOSIS — R42 Dizziness and giddiness: Secondary | ICD-10-CM | POA: Diagnosis not present

## 2017-02-04 DIAGNOSIS — R55 Syncope and collapse: Secondary | ICD-10-CM | POA: Diagnosis not present

## 2017-02-04 DIAGNOSIS — R001 Bradycardia, unspecified: Secondary | ICD-10-CM | POA: Diagnosis not present

## 2017-02-04 DIAGNOSIS — I4891 Unspecified atrial fibrillation: Secondary | ICD-10-CM | POA: Diagnosis not present

## 2017-02-05 DIAGNOSIS — I4891 Unspecified atrial fibrillation: Secondary | ICD-10-CM | POA: Diagnosis not present

## 2017-02-05 DIAGNOSIS — S0181XD Laceration without foreign body of other part of head, subsequent encounter: Secondary | ICD-10-CM | POA: Diagnosis not present

## 2017-02-09 ENCOUNTER — Ambulatory Visit (INDEPENDENT_AMBULATORY_CARE_PROVIDER_SITE_OTHER): Payer: Medicare Other | Admitting: Family Medicine

## 2017-02-09 VITALS — BP 120/70 | HR 72 | Ht <= 58 in | Wt 122.0 lb

## 2017-02-09 DIAGNOSIS — M729 Fibroblastic disorder, unspecified: Secondary | ICD-10-CM | POA: Diagnosis not present

## 2017-02-09 DIAGNOSIS — Z09 Encounter for follow-up examination after completed treatment for conditions other than malignant neoplasm: Secondary | ICD-10-CM

## 2017-02-09 DIAGNOSIS — M9902 Segmental and somatic dysfunction of thoracic region: Secondary | ICD-10-CM | POA: Diagnosis not present

## 2017-02-09 DIAGNOSIS — M546 Pain in thoracic spine: Secondary | ICD-10-CM | POA: Diagnosis not present

## 2017-02-09 DIAGNOSIS — M9903 Segmental and somatic dysfunction of lumbar region: Secondary | ICD-10-CM | POA: Diagnosis not present

## 2017-02-09 NOTE — Progress Notes (Signed)
Name: Tammy Mcdaniel   MRN: [id number]    DOB: 06/17/1922   Date:02/09/2017       Progress Note  Subjective  Chief Complaint  Chief Complaint  Patient presents with  . Follow-up    hospital d/c on 01/28/17- A-fib    Patient presents for transition to care/14 day.  Admission with new onset atrial fib and syncope. Await Holter results and appointment with [facility].   Heart Problem  This is a new (atrial fibrillation) problem. The current episode started 1 to 4 weeks ago. The problem occurs intermittently. The problem has been gradually improving. Pertinent negatives include no abdominal pain, anorexia, chest pain, chills, coughing, fever, headaches, myalgias, nausea, neck pain, rash or sore throat. Treatments tried: midodrine/eliquis. The treatment provided mild relief.    No problem-specific Assessment & Plan notes found for this encounter.   Past Medical History:  Diagnosis Date  . Cancer ([place])   . Colitis, ulcerative ([place])   . Diverticulitis   . Hypertension    Diet controlled  . Kidney stone   . Rosacea   . Sciatica     Past Surgical History:  Procedure Laterality Date  . ABDOMINAL HYSTERECTOMY    . BREAST SURGERY Left   . EYE SURGERY     Lens implant  . MASTECTOMY     Left and Right    Family History  Problem Relation Age of Onset  . Cancer Mother   . Cancer Sister   . Cancer Sister     Social History   Social History  . Marital status: Single    Spouse name: N/A  . Number of children: N/A  . Years of education: N/A   Occupational History  . Not on file.   Social History Main Topics  . Smoking status: Never Smoker  . Smokeless tobacco: Never Used  . Alcohol use No  . Drug use: No  . Sexual activity: Not Currently   Other Topics Concern  . Not on file   Social History Narrative  . No narrative on file    Allergies  Allergen Reactions  . Cephalexin Diarrhea  . Ciprofloxacin Diarrhea and Other (See Comments)    Internal bleeding  . Flagyl  [Metronidazole] Diarrhea  . Gabapentin Other (See Comments)    Dizziness  . Gluten Meal Other (See Comments)    Other Reaction: GI Upset  . Lisinopril Diarrhea    Severe diarrhea  . Naproxen Other (See Comments)    RECTAL BLEEDING  . Other Hives  . Oxycodone Other (See Comments)    Constipation   . Prevacid [Lansoprazole] Diarrhea  . Penicillins Rash  . Pseudoephedrine Rash  . Rogaine  [Minoxidil] Rash  . Sulfa Antibiotics Rash  . Tramadol Rash    Outpatient Medications Prior to Visit  Medication Sig Dispense Refill  . apixaban (ELIQUIS) 2.5 MG TABS tablet Take 1 tablet (2.5 mg total) by mouth 2 (two) times daily. 60 tablet 1  . calcium carbonate (OS-CAL) 600 MG TABS tablet Take 600 mg by mouth 3 (three) times daily with meals.    . Lactobacillus (PROBIOTIC ACIDOPHILUS PO) Take 1 tablet by mouth 3 (three) times daily.    . midodrine (PROAMATINE) 5 MG tablet Take 1 tablet (5 mg total) by mouth 2 (two) times daily with a meal. 60 tablet 0  . Multiple Vitamins-Minerals (PRESERVISION AREDS 2 PO) Take 1 tablet by mouth 2 (two) times daily.    Marland Kitchen aspirin EC 81 MG tablet Take 1 tablet (81  mg total) by mouth daily. 30 tablet 11   No facility-administered medications prior to visit.     Review of Systems  Constitutional: Negative for chills, fever, malaise/fatigue and weight loss.  HENT: Negative for ear discharge, ear pain and sore throat.   Eyes: Negative for blurred vision.  Respiratory: Negative for cough, sputum production, shortness of breath and wheezing.   Cardiovascular: Negative for chest pain, palpitations and leg swelling.  Gastrointestinal: Negative for abdominal pain, anorexia, blood in stool, constipation, diarrhea, heartburn, melena and nausea.  Genitourinary: Negative for dysuria, frequency, hematuria and urgency.  Musculoskeletal: Negative for back pain, joint pain, myalgias and neck pain.  Skin: Negative for rash.  Neurological: Negative for dizziness, tingling,  sensory change, focal weakness and headaches.  Endo/Heme/Allergies: Negative for environmental allergies and polydipsia. Does not bruise/bleed easily.  Psychiatric/Behavioral: Negative for depression and suicidal ideas. The patient is not nervous/anxious and does not have insomnia.      Objective  Vitals:   02/09/17 1409  BP: 120/70  Pulse: 72  Weight: 122 lb (55.3 kg)  Height: 4\' 10"  (1.473 m)    Physical Exam  Constitutional: She is well-developed, well-nourished, and in no distress. No distress.  HENT:  Head: Normocephalic and atraumatic.  Right Ear: External ear normal.  Left Ear: External ear normal.  Nose: Nose normal.  Mouth/Throat: Oropharynx is clear and moist.  Eyes: Conjunctivae and EOM are normal. Pupils are equal, round, and reactive to light. Right eye exhibits no discharge. Left eye exhibits no discharge.  Neck: Normal range of motion. Neck supple. No JVD present. No thyromegaly present.  Cardiovascular: Normal rate, regular rhythm, normal heart sounds and intact distal pulses.  Exam reveals no gallop and no friction rub.   No murmur heard. Pulmonary/Chest: Effort normal and breath sounds normal. She has no wheezes. She has no rales.  Abdominal: Soft. Bowel sounds are normal. She exhibits no mass. There is no tenderness. There is no guarding.  Musculoskeletal: Normal range of motion. She exhibits no edema.  Lymphadenopathy:    She has no cervical adenopathy.  Neurological: She is alert.  Skin: Skin is warm and dry. She is not diaphoretic.  Psychiatric: Mood and affect normal.  Nursing note and vitals reviewed.     Assessment & Plan  Problem List Items Addressed This Visit    None    Visit Diagnoses    Hospital discharge follow-up    -  Primary      No orders of the defined types were placed in this encounter.     Dr. Mac[facility]  02/09/17

## 2017-02-10 DIAGNOSIS — I4891 Unspecified atrial fibrillation: Secondary | ICD-10-CM | POA: Diagnosis not present

## 2017-02-10 DIAGNOSIS — S0181XD Laceration without foreign body of other part of head, subsequent encounter: Secondary | ICD-10-CM | POA: Diagnosis not present

## 2017-02-10 DIAGNOSIS — I480 Paroxysmal atrial fibrillation: Secondary | ICD-10-CM | POA: Diagnosis not present

## 2017-02-11 DIAGNOSIS — I4891 Unspecified atrial fibrillation: Secondary | ICD-10-CM | POA: Diagnosis not present

## 2017-02-11 DIAGNOSIS — R001 Bradycardia, unspecified: Secondary | ICD-10-CM | POA: Diagnosis not present

## 2017-02-11 DIAGNOSIS — R42 Dizziness and giddiness: Secondary | ICD-10-CM | POA: Diagnosis not present

## 2017-02-11 DIAGNOSIS — R55 Syncope and collapse: Secondary | ICD-10-CM | POA: Diagnosis not present

## 2017-02-12 DIAGNOSIS — S0181XD Laceration without foreign body of other part of head, subsequent encounter: Secondary | ICD-10-CM | POA: Diagnosis not present

## 2017-02-12 DIAGNOSIS — I4891 Unspecified atrial fibrillation: Secondary | ICD-10-CM | POA: Diagnosis not present

## 2017-02-20 ENCOUNTER — Emergency Department: Payer: Medicare Other

## 2017-02-20 ENCOUNTER — Emergency Department
Admission: EM | Admit: 2017-02-20 | Discharge: 2017-02-21 | Disposition: A | Discharge status: Home / Self Care | Payer: Medicare Other | Attending: Emergency Medicine | Admitting: Emergency Medicine

## 2017-02-20 ENCOUNTER — Encounter: Payer: Self-pay | Admitting: Emergency Medicine

## 2017-02-20 DIAGNOSIS — Z853 Personal history of malignant neoplasm of breast: Secondary | ICD-10-CM | POA: Insufficient documentation

## 2017-02-20 DIAGNOSIS — Z79899 Other long term (current) drug therapy: Secondary | ICD-10-CM | POA: Diagnosis not present

## 2017-02-20 DIAGNOSIS — I10 Essential (primary) hypertension: Secondary | ICD-10-CM | POA: Diagnosis not present

## 2017-02-20 DIAGNOSIS — R42 Dizziness and giddiness: Secondary | ICD-10-CM | POA: Diagnosis not present

## 2017-02-20 DIAGNOSIS — Z7901 Long term (current) use of anticoagulants: Secondary | ICD-10-CM | POA: Diagnosis not present

## 2017-02-20 DIAGNOSIS — S0990XA Unspecified injury of head, initial encounter: Secondary | ICD-10-CM | POA: Diagnosis not present

## 2017-02-20 DIAGNOSIS — R51 Headache: Secondary | ICD-10-CM | POA: Diagnosis not present

## 2017-02-20 DIAGNOSIS — R262 Difficulty in walking, not elsewhere classified: Secondary | ICD-10-CM | POA: Diagnosis not present

## 2017-02-20 LAB — CBC
HCT: 45.3 % (ref 35.0–47.0)
Hemoglobin: 15.3 g/dL (ref 12.0–16.0)
MCH: 31.1 pg (ref 26.0–34.0)
MCHC: 33.8 g/dL (ref 32.0–36.0)
MCV: 92.1 fL (ref 80.0–100.0)
PLATELETS: 237 10*3/uL (ref 150–440)
RBC: 4.92 MIL/uL (ref 3.80–5.20)
RDW: 14.2 % (ref 11.5–14.5)
WBC: 6.8 10*3/uL (ref 3.6–11.0)

## 2017-02-20 LAB — COMPREHENSIVE METABOLIC PANEL
ALBUMIN: 4.7 g/dL (ref 3.5–5.0)
ALT: 15 U/L (ref 14–54)
AST: 27 U/L (ref 15–41)
Alkaline Phosphatase: 97 U/L (ref 38–126)
Anion gap: 10 (ref 5–15)
BILIRUBIN TOTAL: 0.8 mg/dL (ref 0.3–1.2)
BUN: 24 mg/dL — AB (ref 6–20)
CHLORIDE: 100 mmol/L — AB (ref 101–111)
CO2: 31 mmol/L (ref 22–32)
Calcium: 10.3 mg/dL (ref 8.9–10.3)
Creatinine, Ser: 0.98 mg/dL (ref 0.44–1.00)
GFR calc Af Amer: 56 mL/min — ABNORMAL LOW (ref >60)
GFR calc non Af Amer: 49 mL/min — ABNORMAL LOW (ref >60)
GLUCOSE: 102 mg/dL — AB (ref 65–99)
POTASSIUM: 4.1 mmol/L (ref 3.5–5.1)
SODIUM: 141 mmol/L (ref 135–145)
TOTAL PROTEIN: 7.5 g/dL (ref 6.5–8.1)

## 2017-02-20 LAB — TROPONIN I: Troponin I: <0.03 ng/mL (ref <0.03)

## 2017-02-20 MED ORDER — HYDRALAZINE HCL 20 MG/ML IJ SOLN
5.0000 mg | Freq: Once | INTRAMUSCULAR | Status: AC
  Administered 2017-02-20: 5 mg via INTRAVENOUS
  Filled 2017-02-20: qty 1

## 2017-02-20 MED ORDER — ACETAMINOPHEN 325 MG PO TABS
650.0000 mg | ORAL_TABLET | Freq: Once | ORAL | Status: AC
  Administered 2017-02-20: 650 mg via ORAL
  Filled 2017-02-20: qty 2

## 2017-02-20 MED ORDER — SODIUM CHLORIDE 0.9 % IV SOLN
1000.0000 mL | Freq: Once | INTRAVENOUS | Status: AC
  Administered 2017-02-20: 1000 mL via INTRAVENOUS

## 2017-02-20 NOTE — ED Notes (Signed)
Patient in room after CT.  IV fluids started.

## 2017-02-20 NOTE — ED Notes (Signed)
Patient up to bathroom with this RN in room for assistance if needed.

## 2017-02-20 NOTE — ED Triage Notes (Addendum)
Pt presents to ED via [facility] with c/o hypertension and decreased mobility and dizziness. Per EMS pt had a fall 3 weeks ago, with continued HA 2/10 on the R side. Pt has hx of hypertension and is on medication, remains hypertensive. Per EMS VS: BP 214/96, 100%, 20R, 71HR. EMS reports pt is alert and oriented at this time.

## 2017-02-20 NOTE — ED Notes (Signed)
Patient in CT

## 2017-02-20 NOTE — ED Provider Notes (Signed)
[facility] Emergency Department Provider Note   ____________________________________________    I have reviewed the triage vital signs and the nursing notes.   HISTORY  Chief Complaint Hypertension; Fall; and Dizziness     HPI Tammy Mcdaniel is a 83 y.o. female who presents with complaints of mild dizziness and "not feeling well". Patient reports today she has felt weak all over and within the last few hours felt mildly dizzy. She reports this is somewhat common for her. She did fall one month ago but states she has recovered well since then. She reports her blood pressure has been difficult to control over the last 6 months, prior to that it was usually low. Denies fevers or chills. No dysuria. No chest pain. No shortness of breath. No palpitations. Currently feels well. Reports mild aching right-sided headache   Past Medical History:  Diagnosis Date  . Cancer ([place])   . Colitis, ulcerative ([place])   . Diverticulitis   . Hypertension    Diet controlled  . Kidney stone   . Rosacea   . Sciatica     Patient Active Problem List   Diagnosis Date Noted  . Syncope 01/26/2017    Past Surgical History:  Procedure Laterality Date  . ABDOMINAL HYSTERECTOMY    . BREAST SURGERY Left   . EYE SURGERY     Lens implant  . MASTECTOMY     Left and Right    Prior to Admission medications   Medication Sig Start Date End Date Taking? Authorizing Provider  apixaban (ELIQUIS) 2.5 MG TABS tablet Take 1 tablet (2.5 mg total) by mouth 2 (two) times daily. 01/28/17   Henreitta Leber, MD  calcium carbonate (OS-CAL) 600 MG TABS tablet Take 600 mg by mouth 3 (three) times daily with meals.    [provider]  Lactobacillus (PROBIOTIC ACIDOPHILUS PO) Take 1 tablet by mouth 3 (three) times daily.    [provider]  midodrine (PROAMATINE) 5 MG tablet Take 1 tablet (5 mg total) by mouth 2 (two) times daily with a meal. 01/28/17 02/27/17  Henreitta Leber,  MD  Multiple Vitamins-Minerals (PRESERVISION AREDS 2 PO) Take 1 tablet by mouth 2 (two) times daily.    [provider]     Allergies Cephalexin; Ciprofloxacin; Flagyl [metronidazole]; Gabapentin; Gluten meal; Lisinopril; Naproxen; Other; Oxycodone; Prevacid [lansoprazole]; Penicillins; Pseudoephedrine; Rogaine  [minoxidil]; Sulfa antibiotics; and Tramadol  Family History  Problem Relation Age of Onset  . Cancer Mother   . Cancer Sister   . Cancer Sister     Social History Social History  Substance Use Topics  . Smoking status: Never Smoker  . Smokeless tobacco: Never Used  . Alcohol use No    Review of Systems  Constitutional: No fever/chills Eyes: No visual changes.  ENT: No sore throat. Cardiovascular: Denies chest pain. Respiratory: Denies shortness of breath. Gastrointestinal: No abdominal pain.  No nausea, no vomiting.   Genitourinary: Negative for dysuria. Musculoskeletal: Negative for back pain. Skin: Negative for rash. Neurological: Reports mild right-sided headache, no neuro deficits  ____________________________________________   PHYSICAL EXAM:  VITAL SIGNS: ED Triage Vitals  Enc Vitals Group     BP 02/20/17 2103 (!) 215/87     Pulse Rate 02/20/17 2103 65     Resp 02/20/17 2103 18     Temp 02/20/17 2103 98.1 F (36.7 C)     Temp Source 02/20/17 2103 Oral     SpO2 02/20/17 2103 100 %  Weight 02/20/17 2100 55.3 kg (122 lb)     Height 02/20/17 2100 1.473 m (4\' 10" )     Head Circumference --      Peak Flow --      Pain Score 02/20/17 2100 2     Pain Loc --      Pain Edu? --      Excl. in [place]? --     Constitutional: Alert and oriented. No acute distress. Pleasant and interactive Eyes: Conjunctivae are normal.  Head: Healed laceration to the forehead Nose: No congestion/rhinnorhea. Mouth/Throat: Mucous membranes are moist.    Cardiovascular: Normal rate, regular rhythm. Grossly normal heart sounds.  Good peripheral  circulation. Respiratory: Normal respiratory effort.  No retractions. Lungs CTAB. Gastrointestinal: Soft and nontender. No distention.  No CVA tenderness.  Musculoskeletal:   Warm and well perfused Neurologic:  Normal speech and language. No gross focal neurologic deficits are appreciated.  Skin:  Skin is warm, dry and intact. No rash noted. Psychiatric: Mood and affect are normal. Speech and behavior are normal.  ____________________________________________   LABS (all labs ordered are listed, but only abnormal results are displayed)  Labs Reviewed  COMPREHENSIVE METABOLIC PANEL - Abnormal; Notable for the following:       Result Value   Chloride 100 (*)    Glucose, Bld 102 (*)    BUN 24 (*)    GFR calc non Af Amer 49 (*)    GFR calc Af Amer 56 (*)    All other components within normal limits  CBC  TROPONIN I  URINALYSIS, COMPLETE (UACMP) WITH MICROSCOPIC   ____________________________________________  EKG  ED ECG REPORT I, Lavonia Drafts, the attending physician, personally viewed and interpreted this ECG.  Date: 02/20/2017 EKG Time: 9:45 PM Rate: 56 Rhythm: normal sinus rhythm QRS Axis: normal Intervals: Prolonged PR ST/T Wave abnormalities: normal   ____________________________________________  RADIOLOGY  CT head unremarkable ____________________________________________   PROCEDURES  Procedure(s) performed: No    Critical Care performed:No ____________________________________________   INITIAL IMPRESSION / ASSESSMENT AND PLAN / ED COURSE  Pertinent labs & imaging results that were available during my care of the patient were reviewed by me and considered in my medical decision making (see chart for details).  Patient well-appearing and in no acute distress. She complains of mild dizziness. We will check labs, EKG, give IV fluids and reevaluate.   lab work CT are unremarkable  Patient reports feeling improved after fluids. Her blood pressure  had improved however did start to creep back up again, I will give IV hydralazine. She knows she will need to follow-up with her PCP in regards to her blood pressure. Patient is anxious to go home.    ____________________________________________   FINAL CLINICAL IMPRESSION(S) / ED DIAGNOSES  Final diagnoses:  Dizziness  Essential hypertension      NEW MEDICATIONS STARTED DURING THIS VISIT:  New Prescriptions   No medications on file     Note:  This document was prepared using Dragon voice recognition software and may include unintentional dictation errors.    Lavonia Drafts, MD 02/20/17 [phone]

## 2017-02-21 NOTE — ED Provider Notes (Signed)
-----------------------------------------   12:36 AM on 02/21/2017 -----------------------------------------  BP 157/82. Patient feeling better. We discussed that she should bring her blood pressure log to her PCP when she follows up early next week and she should discuss whether or not to continue her Mitodrine. She has some supposedly absorbable sutures still in her forehead from one month ago; nurse will remove. Strict return precautions given. Patient verbalizes understanding and agrees with plan of care.   Paulette Blanch, MD 02/21/17 [phone]

## 2017-02-21 NOTE — ED Notes (Signed)
Resting quietly.  Awaiting ride home.

## 2017-02-22 ENCOUNTER — Telehealth: Payer: Self-pay | Admitting: Family Medicine

## 2017-02-22 NOTE — Telephone Encounter (Signed)
Just schedule her for my next 30 minute spot

## 2017-02-22 NOTE — Telephone Encounter (Addendum)
PT was in ER on Saturday needs to set up f/u, dont have any appts available until next week. She needs her blood pressure medications changed. She is currently not taking any medication. Please advise DS.

## 2017-02-22 NOTE — Telephone Encounter (Deleted)
Pt is currently not taking any medication.

## 2017-03-02 ENCOUNTER — Ambulatory Visit (INDEPENDENT_AMBULATORY_CARE_PROVIDER_SITE_OTHER): Payer: Medicare Other | Admitting: Family Medicine

## 2017-03-02 ENCOUNTER — Encounter: Payer: Self-pay | Admitting: Family Medicine

## 2017-03-02 VITALS — BP 154/82 | Temp 97.6°F | Wt 120.0 lb

## 2017-03-02 DIAGNOSIS — L210 Seborrhea capitis: Secondary | ICD-10-CM | POA: Diagnosis not present

## 2017-03-02 DIAGNOSIS — B379 Candidiasis, unspecified: Secondary | ICD-10-CM

## 2017-03-02 DIAGNOSIS — I639 Cerebral infarction, unspecified: Secondary | ICD-10-CM

## 2017-03-02 DIAGNOSIS — R42 Dizziness and giddiness: Secondary | ICD-10-CM

## 2017-03-02 MED ORDER — NYSTATIN 100000 UNIT/GM EX CREA: 1.0000 "application " | TOPICAL_CREAM | Freq: Two times a day (BID) | CUTANEOUS | 0 refills | Status: AC

## 2017-03-02 NOTE — Progress Notes (Signed)
Name: Tammy Mcdaniel   MRN: [id number]    DOB: 11/09/1921   Date:03/02/2017       Progress Note  Subjective  Chief Complaint  Chief Complaint  Patient presents with  . Hospitalization Follow-up    Hypertension  This is a new (orthostatic hypoten) problem. The problem has been waxing and waning since onset. The problem is uncontrolled. Associated symptoms include headaches. Pertinent negatives include no anxiety, blurred vision, chest pain, malaise/fatigue, neck pain, orthopnea, palpitations, peripheral edema, PND, shortness of breath or sweats. There are no associated agents to hypertension. There are no known risk factors for coronary artery disease. The current treatment provides mild improvement. There are no compliance problems.  Hypertensive end-organ damage includes CVA. There is no history of angina, kidney disease, CAD/MI, heart failure, left ventricular hypertrophy, PVD or retinopathy.    No problem-specific Assessment & Plan notes found for this encounter.   Past Medical History:  Diagnosis Date  . Cancer ([place])   . Colitis, ulcerative ([place])   . Diverticulitis   . Hypertension    Diet controlled  . Kidney stone   . Rosacea   . Sciatica     Past Surgical History:  Procedure Laterality Date  . ABDOMINAL HYSTERECTOMY    . BREAST SURGERY Left   . EYE SURGERY     Lens implant  . MASTECTOMY     Left and Right    Family History  Problem Relation Age of Onset  . Cancer Mother   . Cancer Sister   . Cancer Sister     Social History   Social History  . Marital status: Single    Spouse name: N/A  . Number of children: N/A  . Years of education: N/A   Occupational History  . Not on file.   Social History Main Topics  . Smoking status: Never Smoker  . Smokeless tobacco: Never Used  . Alcohol use No  . Drug use: No  . Sexual activity: Not Currently   Other Topics Concern  . Not on file   Social History Narrative  . No narrative on file    Allergies   Allergen Reactions  . Cephalexin Diarrhea  . Ciprofloxacin Diarrhea and Other (See Comments)    Internal bleeding  . Flagyl [Metronidazole] Diarrhea  . Gabapentin Other (See Comments)    Dizziness  . Gluten Meal Other (See Comments)    Other Reaction: GI Upset  . Lisinopril Diarrhea    Severe diarrhea  . Naproxen Other (See Comments)    RECTAL BLEEDING  . Other Hives  . Oxycodone Other (See Comments)    Constipation   . Prevacid [Lansoprazole] Diarrhea  . Penicillins Rash  . Pseudoephedrine Rash  . Rogaine  [Minoxidil] Rash  . Sulfa Antibiotics Rash  . Tramadol Rash    Outpatient Medications Prior to Visit  Medication Sig Dispense Refill  . calcium carbonate (OS-CAL) 600 MG TABS tablet Take 600 mg by mouth 3 (three) times daily with meals.    . Lactobacillus (PROBIOTIC ACIDOPHILUS PO) Take 1 tablet by mouth 3 (three) times daily.    . Multiple Vitamins-Minerals (PRESERVISION AREDS 2 PO) Take 1 tablet by mouth 2 (two) times daily.    Marland Kitchen apixaban (ELIQUIS) 2.5 MG TABS tablet Take 1 tablet (2.5 mg total) by mouth 2 (two) times daily. (Patient not taking: Reported on 03/02/2017) 60 tablet 1   No facility-administered medications prior to visit.     Review of Systems  Constitutional: Negative for chills, fever,  malaise/fatigue and weight loss.  HENT: Negative for ear discharge, ear pain and sore throat.   Eyes: Negative for blurred vision.  Respiratory: Negative for cough, sputum production, shortness of breath and wheezing.   Cardiovascular: Negative for chest pain, palpitations, orthopnea, leg swelling and PND.  Gastrointestinal: Negative for abdominal pain, blood in stool, constipation, diarrhea, heartburn, melena and nausea.  Genitourinary: Negative for dysuria, frequency, hematuria and urgency.  Musculoskeletal: Negative for back pain, joint pain, myalgias and neck pain.  Skin: Negative for rash.  Neurological: Positive for headaches. Negative for dizziness, tingling,  sensory change and focal weakness.  Endo/Heme/Allergies: Negative for environmental allergies and polydipsia. Does not bruise/bleed easily.  Psychiatric/Behavioral: Negative for depression and suicidal ideas. The patient is not nervous/anxious and does not have insomnia.      Objective  Vitals:   03/02/17 1002  BP: (!) 154/82  Temp: 97.6 F (36.4 C)  Weight: 120 lb (54.4 kg)    Physical Exam  Constitutional: She is well-developed, well-nourished, and in no distress. No distress.  HENT:  Head: Normocephalic and atraumatic.  Right Ear: External ear normal.  Left Ear: External ear normal.  Nose: Nose normal.  Mouth/Throat: Oropharynx is clear and moist.  Eyes: Conjunctivae and EOM are normal. Pupils are equal, round, and reactive to light. Right eye exhibits no discharge. Left eye exhibits no discharge.  Neck: Normal range of motion. Neck supple. No JVD present. No thyromegaly present.  Cardiovascular: Normal rate, regular rhythm, normal heart sounds and intact distal pulses.  Exam reveals no gallop and no friction rub.   No murmur heard. Pulmonary/Chest: Effort normal and breath sounds normal.  Abdominal: Soft. Bowel sounds are normal.  Musculoskeletal: Normal range of motion. She exhibits no edema.  Lymphadenopathy:    She has no cervical adenopathy.  Neurological: She is alert.  Skin: Skin is warm and dry. She is not diaphoretic.  Psychiatric: Mood and affect normal.  Nursing note and vitals reviewed.     Assessment & Plan  Problem List Items Addressed This Visit    None    Visit Diagnoses    Orthostatic dizziness    -  Primary   cont metodrine   Seborrhea capitis       nizoral shampoo   Candidiasis       Relevant Medications   ketoconazole (NIZORAL) 2 % cream   nystatin cream (MYCOSTATIN)      Meds ordered this encounter  Medications  . nystatin cream (MYCOSTATIN)    Sig: Apply 1 application topically 2 (two) times daily.    Dispense:  30 g    Refill:   0      Dr. [facility]  03/02/17

## 2017-03-23 DIAGNOSIS — R001 Bradycardia, unspecified: Secondary | ICD-10-CM | POA: Diagnosis not present

## 2017-03-23 DIAGNOSIS — R079 Chest pain, unspecified: Secondary | ICD-10-CM | POA: Diagnosis not present

## 2017-03-23 DIAGNOSIS — I4891 Unspecified atrial fibrillation: Secondary | ICD-10-CM | POA: Diagnosis not present

## 2017-03-23 DIAGNOSIS — R55 Syncope and collapse: Secondary | ICD-10-CM | POA: Diagnosis not present

## 2017-03-29 DIAGNOSIS — M9902 Segmental and somatic dysfunction of thoracic region: Secondary | ICD-10-CM | POA: Diagnosis not present

## 2017-03-29 DIAGNOSIS — M729 Fibroblastic disorder, unspecified: Secondary | ICD-10-CM | POA: Diagnosis not present

## 2017-03-29 DIAGNOSIS — M9901 Segmental and somatic dysfunction of cervical region: Secondary | ICD-10-CM | POA: Diagnosis not present

## 2017-03-29 DIAGNOSIS — R51 Headache: Secondary | ICD-10-CM | POA: Diagnosis not present

## 2017-04-08 DIAGNOSIS — R55 Syncope and collapse: Secondary | ICD-10-CM | POA: Diagnosis not present

## 2017-04-08 DIAGNOSIS — I4891 Unspecified atrial fibrillation: Secondary | ICD-10-CM | POA: Diagnosis not present

## 2017-04-28 DIAGNOSIS — M9901 Segmental and somatic dysfunction of cervical region: Secondary | ICD-10-CM | POA: Diagnosis not present

## 2017-04-28 DIAGNOSIS — M729 Fibroblastic disorder, unspecified: Secondary | ICD-10-CM | POA: Diagnosis not present

## 2017-04-28 DIAGNOSIS — R51 Headache: Secondary | ICD-10-CM | POA: Diagnosis not present

## 2017-04-28 DIAGNOSIS — M9902 Segmental and somatic dysfunction of thoracic region: Secondary | ICD-10-CM | POA: Diagnosis not present

## 2017-05-14 DIAGNOSIS — Z170 Estrogen receptor positive status [ER+]: Secondary | ICD-10-CM | POA: Diagnosis not present

## 2017-05-14 DIAGNOSIS — R4020 Unspecified coma: Secondary | ICD-10-CM | POA: Diagnosis not present

## 2017-05-14 DIAGNOSIS — I890 Lymphedema, not elsewhere classified: Secondary | ICD-10-CM | POA: Diagnosis not present

## 2017-05-14 DIAGNOSIS — C50812 Malignant neoplasm of overlapping sites of left female breast: Secondary | ICD-10-CM | POA: Diagnosis not present

## 2017-05-17 DIAGNOSIS — D320 Benign neoplasm of cerebral meninges: Secondary | ICD-10-CM | POA: Diagnosis not present

## 2017-05-17 DIAGNOSIS — Z170 Estrogen receptor positive status [ER+]: Secondary | ICD-10-CM | POA: Diagnosis not present

## 2017-05-17 DIAGNOSIS — Z9011 Acquired absence of right breast and nipple: Secondary | ICD-10-CM | POA: Diagnosis not present

## 2017-05-17 DIAGNOSIS — I890 Lymphedema, not elsewhere classified: Secondary | ICD-10-CM | POA: Diagnosis not present

## 2017-05-17 DIAGNOSIS — R4020 Unspecified coma: Secondary | ICD-10-CM | POA: Diagnosis not present

## 2017-05-17 DIAGNOSIS — Z9013 Acquired absence of bilateral breasts and nipples: Secondary | ICD-10-CM | POA: Diagnosis not present

## 2017-05-17 DIAGNOSIS — C50812 Malignant neoplasm of overlapping sites of left female breast: Secondary | ICD-10-CM | POA: Diagnosis not present

## 2017-05-17 DIAGNOSIS — Z9012 Acquired absence of left breast and nipple: Secondary | ICD-10-CM | POA: Diagnosis not present

## 2017-05-18 DIAGNOSIS — I890 Lymphedema, not elsewhere classified: Secondary | ICD-10-CM | POA: Diagnosis not present

## 2017-05-18 DIAGNOSIS — L905 Scar conditions and fibrosis of skin: Secondary | ICD-10-CM | POA: Diagnosis not present

## 2017-05-20 DIAGNOSIS — R079 Chest pain, unspecified: Secondary | ICD-10-CM | POA: Diagnosis not present

## 2017-05-20 DIAGNOSIS — R55 Syncope and collapse: Secondary | ICD-10-CM | POA: Diagnosis not present

## 2017-05-20 DIAGNOSIS — R42 Dizziness and giddiness: Secondary | ICD-10-CM | POA: Diagnosis not present

## 2017-05-20 DIAGNOSIS — I4891 Unspecified atrial fibrillation: Secondary | ICD-10-CM | POA: Diagnosis not present

## 2017-05-20 DIAGNOSIS — R001 Bradycardia, unspecified: Secondary | ICD-10-CM | POA: Diagnosis not present

## 2017-05-31 ENCOUNTER — Ambulatory Visit (INDEPENDENT_AMBULATORY_CARE_PROVIDER_SITE_OTHER): Payer: Medicare Other | Admitting: Family Medicine

## 2017-05-31 ENCOUNTER — Encounter: Payer: Self-pay | Admitting: Family Medicine

## 2017-05-31 ENCOUNTER — Ambulatory Visit: Payer: Self-pay

## 2017-05-31 VITALS — BP 130/80 | HR 64 | Temp 98.0°F | Resp 12 | Ht <= 58 in | Wt 122.0 lb

## 2017-05-31 DIAGNOSIS — Z022 Encounter for examination for admission to residential institution: Secondary | ICD-10-CM | POA: Diagnosis not present

## 2017-05-31 DIAGNOSIS — I639 Cerebral infarction, unspecified: Secondary | ICD-10-CM

## 2017-05-31 DIAGNOSIS — Z111 Encounter for screening for respiratory tuberculosis: Secondary | ICD-10-CM

## 2017-05-31 NOTE — Progress Notes (Signed)
Name: Tammy Mcdaniel   MRN: [id number]    DOB: 1922-03-18   Date:05/31/2017       Progress Note  Subjective  Chief Complaint  Chief Complaint  Patient presents with  . form    en[facility] into [facility] community    Patient presents for face-to-face encounter for care facility preadmission.    No problem-specific Assessment & Plan notes found for this encounter.   Past Medical History:  Diagnosis Date  . Cancer ([place])   . Colitis, ulcerative ([place])   . Diverticulitis   . Hypertension    Diet controlled  . Kidney stone   . Rosacea   . Sciatica     Past Surgical History:  Procedure Laterality Date  . ABDOMINAL HYSTERECTOMY    . BREAST SURGERY Left   . EYE SURGERY     Lens implant  . MASTECTOMY     Left and Right    Family History  Problem Relation Age of Onset  . Cancer Mother   . Cancer Sister   . Cancer Sister     Social History   Social History  . Marital status: Single    Spouse name: N/A  . Number of children: N/A  . Years of education: N/A   Occupational History  . Not on file.   Social History Main Topics  . Smoking status: Never Smoker  . Smokeless tobacco: Never Used  . Alcohol use No  . Drug use: No  . Sexual activity: Not Currently   Other Topics Concern  . Not on file   Social History Narrative  . No narrative on file    Allergies  Allergen Reactions  . Cephalexin Diarrhea  . Ciprofloxacin Diarrhea and Other (See Comments)    Internal bleeding  . Flagyl [Metronidazole] Diarrhea  . Gabapentin Other (See Comments)    Dizziness  . Gluten Meal Other (See Comments)    Other Reaction: GI Upset  . Lisinopril Diarrhea    Severe diarrhea  . Naproxen Other (See Comments)    RECTAL BLEEDING  . Other Hives  . Oxycodone Other (See Comments)    Constipation   . Prevacid [Lansoprazole] Diarrhea  . Penicillins Rash  . Pseudoephedrine Rash  . Rogaine  [Minoxidil] Rash  . Sulfa Antibiotics Rash  . Tramadol Rash    Outpatient  Medications Prior to Visit  Medication Sig Dispense Refill  . calcium carbonate (OS-CAL) 600 MG TABS tablet Take 600 mg by mouth 3 (three) times daily with meals.    . midodrine (PROAMATINE) 5 MG tablet Take by mouth.    . Multiple Vitamins-Minerals (PRESERVISION AREDS 2 PO) Take 1 tablet by mouth 2 (two) times daily.    Marland Kitchen nystatin cream (MYCOSTATIN) Apply 1 application topically 2 (two) times daily. 30 g 0  . amLODipine (NORVASC) 5 MG tablet     . hydrocortisone (ANUSOL-HC) 2.5 % rectal cream     . ketoconazole (NIZORAL) 2 % cream     . Lactobacillus (PROBIOTIC ACIDOPHILUS PO) Take 1 tablet by mouth 3 (three) times daily.     No facility-administered medications prior to visit.     Review of Systems  Constitutional: Negative for chills, fever, malaise/fatigue and weight loss.  HENT: Negative for ear discharge, ear pain and sore throat.   Eyes: Negative for blurred vision.  Respiratory: Negative for cough, sputum production, shortness of breath and wheezing.   Cardiovascular: Negative for chest pain, palpitations and leg swelling.  Gastrointestinal: Negative for abdominal pain, blood in stool,  constipation, diarrhea, heartburn, melena and nausea.  Genitourinary: Negative for dysuria, frequency, hematuria and urgency.  Musculoskeletal: Negative for back pain, joint pain, myalgias and neck pain.  Skin: Negative for rash.  Neurological: Negative for dizziness, tingling, sensory change, focal weakness and headaches.  Endo/Heme/Allergies: Negative for environmental allergies and polydipsia. Does not bruise/bleed easily.  Psychiatric/Behavioral: Negative for depression and suicidal ideas. The patient is not nervous/anxious and does not have insomnia.      Objective  Vitals:   05/31/17 1014  BP: 130/80  Pulse: 64  Resp: 12  Temp: 98 F (36.7 C)  Weight: 122 lb (55.3 kg)  Height: 4\' 10"  (1.473 m)    Physical Exam  Constitutional: She is well-developed, well-nourished, and in no  distress. No distress.  HENT:  Head: Normocephalic and atraumatic.  Right Ear: Tympanic membrane, external ear and ear canal normal.  Left Ear: External ear normal.  Nose: Nose normal.  Mouth/Throat: Uvula is midline, oropharynx is clear and moist and mucous membranes are normal. No oropharyngeal exudate, posterior oropharyngeal edema, posterior oropharyngeal erythema or tonsillar abscesses.  Cerumen right  Eyes: Pupils are equal, round, and reactive to light. Conjunctivae and EOM are normal. Right eye exhibits no discharge. Left eye exhibits no discharge.  Fundoscopic exam:      The right eye shows no arteriolar narrowing, no AV nicking and no papilledema.       The left eye shows no arteriolar narrowing, no AV nicking and no papilledema.  Neck: Trachea normal and normal range of motion. Neck supple. Normal carotid pulses, no hepatojugular reflux and no JVD present. Carotid bruit is not present. No thyroid mass and no thyromegaly present.  Cardiovascular: Normal rate, regular rhythm, S1 normal, S2 normal, normal heart sounds and intact distal pulses.  Exam reveals no gallop, no S3, no S4 and no friction rub.   No murmur heard. Pulses:      Carotid pulses are 2+ on the right side, and 2+ on the left side.      Radial pulses are 2+ on the right side, and 2+ on the left side.       Femoral pulses are 2+ on the right side, and 2+ on the left side.      Popliteal pulses are 2+ on the right side, and 2+ on the left side.       Dorsalis pedis pulses are 2+ on the right side, and 2+ on the left side.       Posterior tibial pulses are 2+ on the right side, and 2+ on the left side.  Pulmonary/Chest: Effort normal and breath sounds normal. She has no decreased breath sounds. She has no wheezes. She has no rhonchi. She has no rales.  Abdominal: Soft. Bowel sounds are normal. She exhibits no mass. There is no hepatosplenomegaly. There is no tenderness. There is no guarding and no CVA tenderness.   Musculoskeletal: Normal range of motion. She exhibits no edema.  Lymphadenopathy:       Head (right side): No submandibular adenopathy present.       Head (left side): No submandibular adenopathy present.    She has no cervical adenopathy.  Neurological: She is alert. She has normal motor skills, normal sensation, normal strength, normal reflexes and intact cranial nerves.  Skin: Skin is warm, dry and intact. She is not diaphoretic.  Psychiatric: Mood and affect normal.  Nursing note and vitals reviewed.     Assessment & Plan  Problem List Items Addressed This Visit  None    Visit Diagnoses    Screening-pulmonary TB    -  Primary   Relevant Orders   TB Skin Test (Completed)   Encounter for examination for admission to assisted living facility         I spent 45 minutes with this patient, More than 50% of that time was spent in face to face education, counseling and care coordination. No orders of the defined types were placed in this encounter.     Dr. [facility]  05/31/17

## 2017-06-05 ENCOUNTER — Encounter: Payer: Self-pay | Admitting: Gynecology

## 2017-06-05 ENCOUNTER — Ambulatory Visit
Admission: EM | Admit: 2017-06-05 | Discharge: 2017-06-05 | Disposition: A | Discharge status: Home / Self Care | Payer: Medicare Other | Attending: Family Medicine | Admitting: Family Medicine

## 2017-06-05 DIAGNOSIS — R21 Rash and other nonspecific skin eruption: Secondary | ICD-10-CM

## 2017-06-05 DIAGNOSIS — N898 Other specified noninflammatory disorders of vagina: Secondary | ICD-10-CM

## 2017-06-05 MED ORDER — KETOCONAZOLE 2 % EX CREA: 1.0000 "application " | TOPICAL_CREAM | Freq: Every day | CUTANEOUS | 0 refills | Status: AC

## 2017-06-05 MED ORDER — FLUCONAZOLE 200 MG PO TABS: 200.0000 mg | ORAL_TABLET | Freq: Once | ORAL | 1 refills | Status: AC

## 2017-06-05 NOTE — ED Provider Notes (Signed)
[facility]    CSN: [id number] Arrival date & time: 06/05/17  1601     History   Chief Complaint Chief Complaint  Patient presents with  . Rash    HPI Tammy Mcdaniel is a 83 y.o. female.   Patient is a 83 year old female who presents with complaint of genital area rash that she believes has been worsening. Patient states her rash started about a week after she was started on midodrine, which per records was August 2. Patient states that recently the rash has been the point where it is keeping her from sleeping well at night. Patient states she saw her PCP last week but didn't think to mention the rash at that time. Patient states she did try an over-the-counter use infection medication seemed to help for a short period within the rash and itching came back. Patient reports a history of frequency for years and denies any pain or burning with urination. Patient denies any vaginal discharge but reports it is difficult to tell if she has the known frequency. Patient denies using any creams or powders on a normal basis. Patient denies any chest pain, shortness of breath, abdominal pain. Patient denies any internal vaginal itching, pain, or other symptoms.      Past Medical History:  Diagnosis Date  . Cancer ([place])   . Colitis, ulcerative ([place])   . Diverticulitis   . Hypertension    Diet controlled  . Kidney stone   . Rosacea   . Sciatica     Patient Active Problem List   Diagnosis Date Noted  . Syncope 01/26/2017    Past Surgical History:  Procedure Laterality Date  . ABDOMINAL HYSTERECTOMY    . BREAST SURGERY Left   . EYE SURGERY     Lens implant  . MASTECTOMY     Left and Right    OB History    No data available       Home Medications    Prior to Admission medications   Medication Sig Start Date End Date Taking? Authorizing Provider  calcium carbonate (OS-CAL) 600 MG TABS tablet Take 600 mg by mouth 3 (three) times daily with meals.   Yes [provider]  midodrine (PROAMATINE) 5 MG tablet Take by mouth. 03/01/17 03/01/18 Yes [provider]  Multiple Vitamins-Minerals (PRESERVISION AREDS 2 PO) Take 1 tablet by mouth 2 (two) times daily.   Yes [provider]  nystatin cream (MYCOSTATIN) Apply 1 application topically 2 (two) times daily. 03/02/17  Yes Juline Patch, MD  fluconazole (DIFLUCAN) 200 MG tablet Take 1 tablet (200 mg total) by mouth once. Take one tablet for infection. Can repeat in 7 days if no improvement 06/05/17 06/05/17  Luvenia Redden, PA-C  ketoconazole (NIZORAL) 2 % cream Apply 1 application topically daily. 06/05/17   Luvenia Redden, PA-C    Family History Family History  Problem Relation Age of Onset  . Cancer Mother   . Cancer Sister   . Cancer Sister     Social History Social History  Substance Use Topics  . Smoking status: Never Smoker  . Smokeless tobacco: Never Used  . Alcohol use No     Allergies   Cephalexin; Ciprofloxacin; Flagyl [metronidazole]; Gabapentin; Gluten meal; Lisinopril; Naproxen; Other; Oxycodone; Prevacid [lansoprazole]; Penicillins; Pseudoephedrine; Rogaine  [minoxidil]; Sulfa antibiotics; and Tramadol   Review of Systems Review of Systems  As noted above in history of present illness. Other systems reviewed and found to be negative.  Physical Exam Triage Vital Signs ED Triage Vitals  Enc Vitals Group     BP 06/05/17 0956 (!) 190/88     Pulse Rate 06/05/17 0956 63     Resp 06/05/17 0956 16     Temp 06/05/17 0956 97.8 F (36.6 C)     Temp Source 06/05/17 0956 Oral     SpO2 06/05/17 0956 100 %     Weight 06/05/17 0957 117 lb (53.1 kg)     Height 06/05/17 0957 4\' 10"  (1.473 m)     Head Circumference --      Peak Flow --      Pain Score 06/05/17 0957 0     Pain Loc --      Pain Edu? --      Excl. in [place]? --    No data found.   Updated Vital Signs BP (!) 190/88 (BP Location: Right Arm)   Pulse 63   Temp 97.8 F (36.6 C) (Oral)    Resp 16   Ht 4\' 10"  (1.473 m)   Wt 117 lb (53.1 kg)   SpO2 100%   BMI 24.45 kg/m   Visual Acuity Right Eye Distance:   Left Eye Distance:   Bilateral Distance:    Right Eye Near:   Left Eye Near:    Bilateral Near:     Physical Exam  Constitutional: She appears well-developed and well-nourished. No distress.  HENT:  Head: Atraumatic.  Eyes: Pupils are equal, round, and reactive to light. EOM are normal.  Abdominal: Soft. She exhibits no distension. There is no tenderness. There is no guarding.  Genitourinary: There is rash on the right labia. There is rash on the left labia.  Genitourinary Comments: Mild erythematous rash to the outside of her labia. No redness or discoloration noted to the inner labia. No discharge or foul odor noted.   Physical exam done in presence of [place], Therapist, sports.  UC Treatments / Results  Labs (all labs ordered are listed, but only abnormal results are displayed) Labs Reviewed - No data to display  EKG  EKG Interpretation None       Radiology No results found.  Procedures Procedures (including critical care time)  Medications Ordered in UC Medications - No data to display   Initial Impression / Assessment and Plan / UC Course  I have reviewed the triage vital signs and the nursing notes.  Pertinent labs & imaging results that were available during my care of the patient were reviewed by me and considered in my medical decision making (see chart for details).    Patient reports genital area itching that began about 1 week after starting Midrin. He is using patient does report some short-term improvement after and over-the-counter yeast infection medication but then it returned. Patient denies any internal symptoms. There is a 12% incidence of itching with Midrin according to up-to-date. However, given her short-term improvement with the over-the-counter medication, we'll go ahead and treat her for a fungal infection. The patient follow-up if  she has no improvement and will ask her to go ahead and follow with her cardiologist in regards to this possible reaction.  Final Clinical Impressions(s) / UC Diagnoses   Final diagnoses:  Rash  Itching in the vaginal area    New Prescriptions New Prescriptions   FLUCONAZOLE (DIFLUCAN) 200 MG TABLET    Take 1 tablet (200 mg total) by mouth once. Take one tablet for infection. Can repeat in 7 days if no improvement   KETOCONAZOLE (  NIZORAL) 2 % CREAM    Apply 1 application topically daily.     Controlled Substance Prescriptions [place] Controlled Substance Registry consulted? Not Applicable   Luvenia Redden, PA-C    Luvenia Redden, PA-C 06/05/17 1031

## 2017-06-05 NOTE — ED Triage Notes (Signed)
Patient c/o rashes at vaginal area . Per patient was given new Rx. Midodrine x 2 months when she first experience itching, per patient now with rash.

## 2017-06-05 NOTE — Discharge Instructions (Signed)
-  Fluconazole: take one tablet today. Can repeat in one week if needed -ketoconazole cream: apply once a day for 14 days -follow up with PCP in 2 weeks

## 2017-06-09 DIAGNOSIS — L905 Scar conditions and fibrosis of skin: Secondary | ICD-10-CM | POA: Diagnosis not present

## 2017-06-09 DIAGNOSIS — M6281 Muscle weakness (generalized): Secondary | ICD-10-CM | POA: Diagnosis not present

## 2017-06-09 DIAGNOSIS — M6289 Other specified disorders of muscle: Secondary | ICD-10-CM | POA: Diagnosis not present

## 2017-06-09 DIAGNOSIS — I890 Lymphedema, not elsewhere classified: Secondary | ICD-10-CM | POA: Diagnosis not present

## 2017-06-10 DIAGNOSIS — I890 Lymphedema, not elsewhere classified: Secondary | ICD-10-CM | POA: Diagnosis not present

## 2017-06-10 DIAGNOSIS — R4020 Unspecified coma: Secondary | ICD-10-CM | POA: Diagnosis not present

## 2017-06-10 DIAGNOSIS — C50812 Malignant neoplasm of overlapping sites of left female breast: Secondary | ICD-10-CM | POA: Diagnosis not present

## 2017-06-10 DIAGNOSIS — Z170 Estrogen receptor positive status [ER+]: Secondary | ICD-10-CM | POA: Diagnosis not present

## 2017-06-16 DIAGNOSIS — I890 Lymphedema, not elsewhere classified: Secondary | ICD-10-CM | POA: Diagnosis not present

## 2017-06-16 DIAGNOSIS — M729 Fibroblastic disorder, unspecified: Secondary | ICD-10-CM | POA: Diagnosis not present

## 2017-06-16 DIAGNOSIS — M9905 Segmental and somatic dysfunction of pelvic region: Secondary | ICD-10-CM | POA: Diagnosis not present

## 2017-06-16 DIAGNOSIS — M6289 Other specified disorders of muscle: Secondary | ICD-10-CM | POA: Diagnosis not present

## 2017-06-16 DIAGNOSIS — M9904 Segmental and somatic dysfunction of sacral region: Secondary | ICD-10-CM | POA: Diagnosis not present

## 2017-06-16 DIAGNOSIS — M545 Low back pain: Secondary | ICD-10-CM | POA: Diagnosis not present

## 2017-06-16 DIAGNOSIS — M6281 Muscle weakness (generalized): Secondary | ICD-10-CM | POA: Diagnosis not present

## 2017-06-16 DIAGNOSIS — L905 Scar conditions and fibrosis of skin: Secondary | ICD-10-CM | POA: Diagnosis not present

## 2017-06-22 ENCOUNTER — Other Ambulatory Visit: Payer: Self-pay

## 2017-06-23 DIAGNOSIS — M6281 Muscle weakness (generalized): Secondary | ICD-10-CM | POA: Diagnosis not present

## 2017-06-23 DIAGNOSIS — M6289 Other specified disorders of muscle: Secondary | ICD-10-CM | POA: Diagnosis not present

## 2017-06-23 DIAGNOSIS — L905 Scar conditions and fibrosis of skin: Secondary | ICD-10-CM | POA: Diagnosis not present

## 2017-06-23 DIAGNOSIS — I890 Lymphedema, not elsewhere classified: Secondary | ICD-10-CM | POA: Diagnosis not present

## 2017-06-25 ENCOUNTER — Ambulatory Visit
Admission: EM | Admit: 2017-06-25 | Discharge: 2017-06-25 | Disposition: A | Discharge status: Home / Self Care | Payer: Medicare Other | Attending: Family Medicine | Admitting: Family Medicine

## 2017-06-25 ENCOUNTER — Ambulatory Visit: Payer: Medicare Other

## 2017-06-25 DIAGNOSIS — X58XXXA Exposure to other specified factors, initial encounter: Secondary | ICD-10-CM | POA: Insufficient documentation

## 2017-06-25 DIAGNOSIS — S8011XA Contusion of right lower leg, initial encounter: Secondary | ICD-10-CM | POA: Diagnosis not present

## 2017-06-25 DIAGNOSIS — S59902A Unspecified injury of left elbow, initial encounter: Secondary | ICD-10-CM | POA: Diagnosis not present

## 2017-06-25 DIAGNOSIS — S40022A Contusion of left upper arm, initial encounter: Secondary | ICD-10-CM | POA: Diagnosis not present

## 2017-06-25 DIAGNOSIS — S8991XA Unspecified injury of right lower leg, initial encounter: Secondary | ICD-10-CM | POA: Diagnosis not present

## 2017-06-25 DIAGNOSIS — S5002XA Contusion of left elbow, initial encounter: Secondary | ICD-10-CM | POA: Diagnosis not present

## 2017-06-25 DIAGNOSIS — M7989 Other specified soft tissue disorders: Secondary | ICD-10-CM | POA: Diagnosis not present

## 2017-06-25 DIAGNOSIS — M79602 Pain in left arm: Secondary | ICD-10-CM | POA: Diagnosis present

## 2017-06-25 MED ORDER — ONDANSETRON 4 MG PO TBDP
4.0000 mg | ORAL_TABLET | Freq: Once | ORAL | Status: AC
  Administered 2017-06-25: 4 mg via ORAL

## 2017-06-25 NOTE — ED Provider Notes (Signed)
[facility]    CSN: [id number] Arrival date & time: 06/25/17  0827     History   Chief Complaint Chief Complaint  Patient presents with  . Arm Pain    left    HPI Tammy Mcdaniel is a 83 y.o. female.   HPI  83 year old female presents with left larm pain bruising and swelling.Patient states that yesterday she was looking at a new retirement home and when she was exiting the door it  swung back and the handle hit her upper left arm just above the elbow. She started noticing significant swelling and bruising with extension of the swelling noticed today. Says that the area that the door hit is very tender. She has not had any numbness or tingling into her fingers and wrist and has had full function of the distal extremity. He also relates last week when she tripped over a box that she was packing ending on her right side with a bruise to her left lateral leg below the knee.she's been able to ambulate on the leg.       Past Medical History:  Diagnosis Date  . Cancer ([place])   . Colitis, ulcerative ([place])   . Diverticulitis   . Hypertension    Diet controlled  . Kidney stone   . Rosacea   . Sciatica     Patient Active Problem List   Diagnosis Date Noted  . Syncope 01/26/2017    Past Surgical History:  Procedure Laterality Date  . ABDOMINAL HYSTERECTOMY    . BREAST SURGERY Left   . EYE SURGERY     Lens implant  . MASTECTOMY     Left and Right    OB History    No data available       Home Medications    Prior to Admission medications   Medication Sig Start Date End Date Taking? Authorizing Provider  calcium carbonate (OS-CAL) 600 MG TABS tablet Take 600 mg by mouth 3 (three) times daily with meals.   Yes [provider]  ketoconazole (NIZORAL) 2 % cream Apply 1 application topically daily. 06/05/17  Yes Luvenia Redden, PA-C  midodrine (PROAMATINE) 5 MG tablet Take by mouth. 03/01/17 03/01/18 Yes [provider]  Multiple  Vitamins-Minerals (PRESERVISION AREDS 2 PO) Take 1 tablet by mouth 2 (two) times daily.   Yes [provider]  nystatin cream (MYCOSTATIN) Apply 1 application topically 2 (two) times daily. 03/02/17  Yes Juline Patch, MD    Family History Family History  Problem Relation Age of Onset  . Cancer Mother   . Cancer Sister   . Cancer Sister     Social History Social History  Substance Use Topics  . Smoking status: Never Smoker  . Smokeless tobacco: Never Used  . Alcohol use No     Allergies   Cephalexin; Ciprofloxacin; Flagyl [metronidazole]; Gabapentin; Gluten meal; Lisinopril; Naproxen; Other; Oxycodone; Prevacid [lansoprazole]; Penicillins; Pseudoephedrine; Rogaine  [minoxidil]; Sulfa antibiotics; and Tramadol   Review of Systems Review of Systems  Constitutional: Positive for activity change. Negative for appetite change, chills, fatigue and fever.  Skin: Positive for color change.  All other systems reviewed and are negative.    Physical Exam Triage Vital Signs ED Triage Vitals  Enc Vitals Group     BP 06/25/17 0845 (!) 164/74     Pulse Rate 06/25/17 0845 66     Resp 06/25/17 0845 18     Temp 06/25/17 0845 97.7 F (36.5 C)  Temp Source 06/25/17 0845 Oral     SpO2 06/25/17 0845 98 %     Weight 06/25/17 0842 120 lb (54.4 kg)     Height --      Head Circumference --      Peak Flow --      Pain Score 06/25/17 0842 4     Pain Loc --      Pain Edu? --      Excl. in [place]? --    No data found.   Updated Vital Signs BP (!) 164/74 (BP Location: Left Arm)   Pulse 66   Temp 97.7 F (36.5 C) (Oral)   Resp 18   Wt 120 lb (54.4 kg)   SpO2 98%   BMI 25.08 kg/m   Visual Acuity Right Eye Distance:   Left Eye Distance:   Bilateral Distance:    Right Eye Near:   Left Eye Near:    Bilateral Near:     Physical Exam  Constitutional: She is oriented to person, place, and time. She appears well-developed and well-nourished. No distress.  HENT:  Head:  Normocephalic.  Eyes: Pupils are equal, round, and reactive to light.  Neck: Normal range of motion.  Musculoskeletal: Normal range of motion. She exhibits edema and tenderness.  Examination of the left upper extremity shows marked Swelling and ecchymosis about the elbow both  superior and inferior to the elbow. Most tenderness noted is anterior.Pullses are present distally. She has normal function distally. There is tenderness over the distal humerus 4 finger breaths above the elbow antecubital fossa.  Examination of the right leg distal to the knee on the anterolateral aspect shows another area of ecchymosis and swelling. Distal function is intact. She ambulates without problem.  Neurological: She is alert and oriented to person, place, and time.  Skin: Skin is warm and dry. She is not diaphoretic.  Psychiatric: She has a normal mood and affect. Her behavior is normal. Judgment and thought content normal.  Nursing note and vitals reviewed.    UC Treatments / Results  Labs (all labs ordered are listed, but only abnormal results are displayed) Labs Reviewed - No data to display  EKG  EKG Interpretation None       Radiology Dg Elbow Complete Left  Result Date: 06/25/2017 CLINICAL DATA:  two injuries within 1 week. [place] one week ago tripped over box and injured right tib fib. Most pain and bruising with swelling on right prox anterolateral lower leg. Most recent injury was yesterday morning--was walking through door and handle ADA door handle came back on her left elbow area causing massive swelling and bruising all about the elbow and up to mid humerus with pain distal humerus just above elbow and posteriorly. Pt has lymphedema in left arm from prior injury in hand EXAM: LEFT ELBOW - COMPLETE 3+ VIEW COMPARISON:  None. FINDINGS: There is no evidence of fracture, dislocation, or joint effusion. There is no evidence of arthropathy or other focal bone abnormality. Focal soft tissue swelling  lateral to the distal humeral shaft. IMPRESSION: Soft tissue swelling without bone abnormality. Electronically Signed   By: Lucrezia Europe M.D.   On: 06/25/2017 09:53   Dg Tibia/fibula Right  Result Date: 06/25/2017 CLINICAL DATA:  83 year old female post fall.  Initial encounter. EXAM: RIGHT TIBIA AND FIBULA - 2 VIEW COMPARISON:  None. FINDINGS: No fracture or dislocation. Lateral tibiofemoral joint space and patellofemoral joint degenerative changes. Tiny plantar spur. No obvious soft tissue abnormality. IMPRESSION: No fracture  or dislocation. Electronically Signed   By: Genia Del M.D.   On: 06/25/2017 09:55    Procedures Procedures (including critical care time)  Medications Ordered in UC Medications  ondansetron (ZOFRAN-ODT) disintegrating tablet 4 mg (4 mg Oral Given 06/25/17 1037)     Initial Impression / Assessment and Plan / UC Course  I have reviewed the triage vital signs and the nursing notes.  Pertinent labs & imaging results that were available during my care of the patient were reviewed by me and considered in my medical decision making (see chart for details).     I had Dr. Lacinda Axon also examine the patient. He agreed that the patient would benefit from being seen today by orthopedics for evaluation of the hematoma which is rather large- seemingly expanding.We were ableTo obtain an appointment at 3 PM this afternoon in [facility] with [facility].patient states that her daughter can drive her there without a problem. She was given a copy of the x-rays on a disc.To continue applying ice to the area 20 minutes out of every 2 hours 4-5 times daily until she is seen at [facility].  Final Clinical Impressions(s) / UC Diagnoses   Final diagnoses:  Traumatic hematoma of left upper arm, initial encounter  Traumatic hematoma of lower leg, right, initial encounter    New Prescriptions New Prescriptions   No medications on file     Controlled Substance  Prescriptions [place] Controlled Substance Registry consulted? Not Applicable   Lorin Picket, PA-C 06/25/17 1052

## 2017-06-25 NOTE — ED Triage Notes (Signed)
Patient complains of left arm pain that occurred when she was walking out of the door at the retirement home. Patient states that the door swung back and the handle hit her in her upper left arm slightly above elbow. Patient has significant bruising, swelling and pain that started yesterday. Patient states that bruising has spread down her arm.

## 2017-06-28 DIAGNOSIS — M6289 Other specified disorders of muscle: Secondary | ICD-10-CM | POA: Diagnosis not present

## 2017-06-28 DIAGNOSIS — I890 Lymphedema, not elsewhere classified: Secondary | ICD-10-CM | POA: Diagnosis not present

## 2017-06-28 DIAGNOSIS — L905 Scar conditions and fibrosis of skin: Secondary | ICD-10-CM | POA: Diagnosis not present

## 2017-06-28 DIAGNOSIS — M6281 Muscle weakness (generalized): Secondary | ICD-10-CM | POA: Diagnosis not present

## 2017-06-30 DIAGNOSIS — L905 Scar conditions and fibrosis of skin: Secondary | ICD-10-CM | POA: Diagnosis not present

## 2017-06-30 DIAGNOSIS — M6281 Muscle weakness (generalized): Secondary | ICD-10-CM | POA: Diagnosis not present

## 2017-06-30 DIAGNOSIS — M6289 Other specified disorders of muscle: Secondary | ICD-10-CM | POA: Diagnosis not present

## 2017-06-30 DIAGNOSIS — I890 Lymphedema, not elsewhere classified: Secondary | ICD-10-CM | POA: Diagnosis not present

## 2017-07-02 IMAGING — CT CT HEAD W/O CM
2 of 5 series · 10 of 33 positions shown, 11 images · non-contrast
Comparison: 05/07/2011

CLINICAL DATA: Fall, hit her head

EXAM:
CT HEAD WITHOUT CONTRAST
CT CERVICAL SPINE WITHOUT CONTRAST
TECHNIQUE: Multidetector CT imaging of the head and cervical spine was
performed following the standard protocol without intravenous
contrast. Multiplanar CT image reconstructions of the cervical spine
were also generated.

[Series 2: head wo · axial · 0.41mm/px · z∈[-52,-7]mm · 2 of 28 slices shown, 3 images]
[im 10/28  soft-tissue]
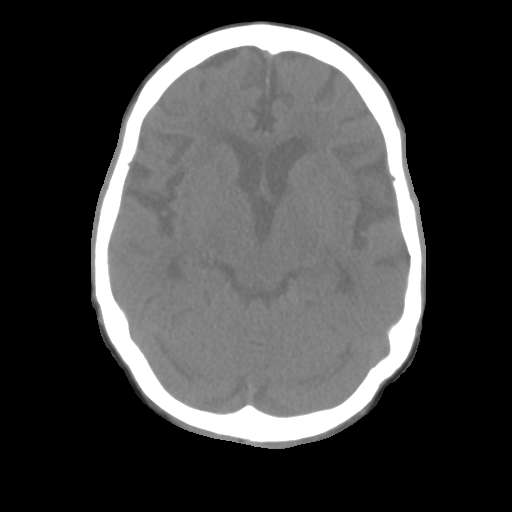
[im 10/28  bone]
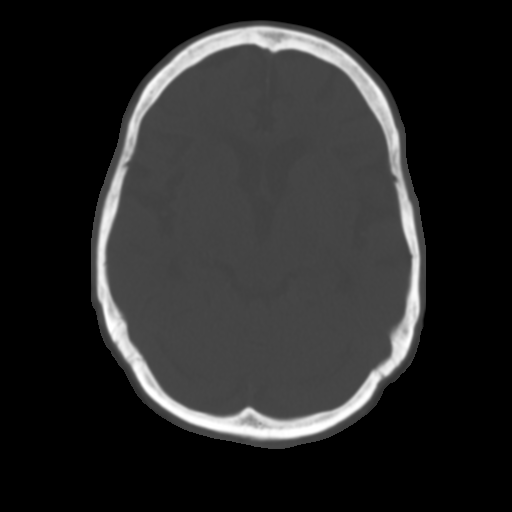
[im 19/28  soft-tissue]
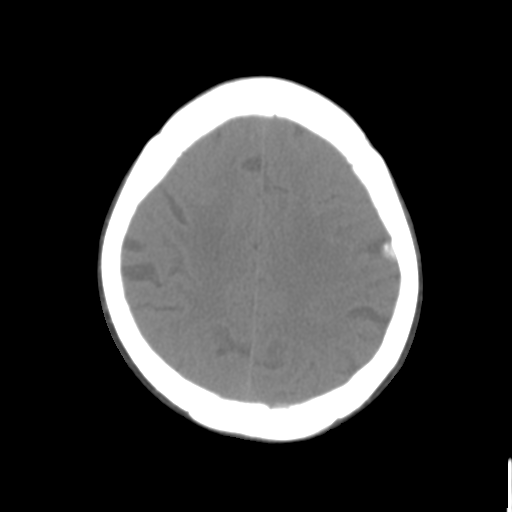

[Series 7: c spine soft · axial · 0.42mm/px · z∈[-222,-96]mm · 8 of 82 slices shown]
[im 10/82  soft-tissue]
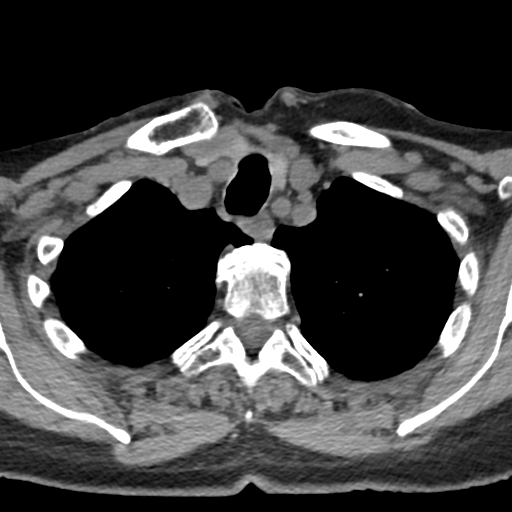
[im 19/82  soft-tissue]
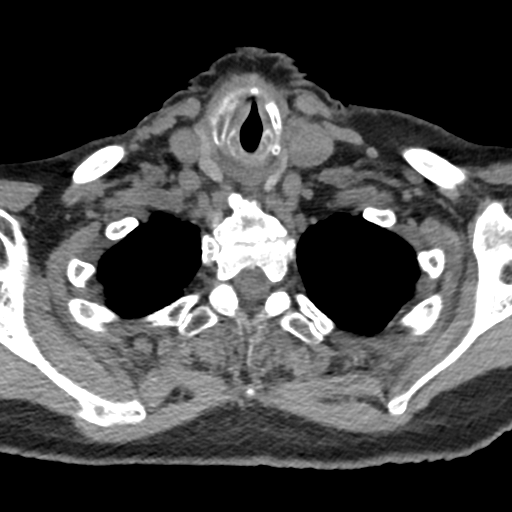
[im 28/82  soft-tissue]
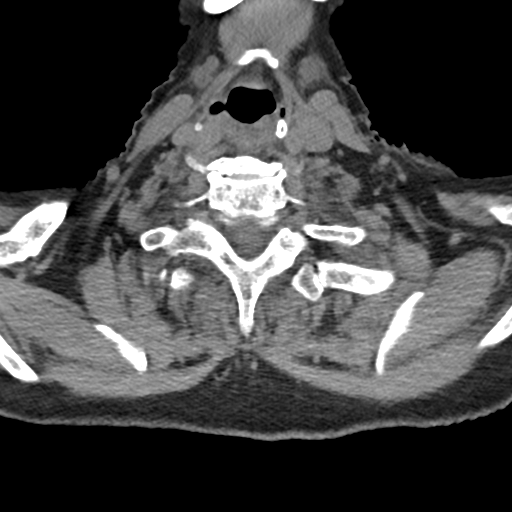
[im 37/82  soft-tissue]
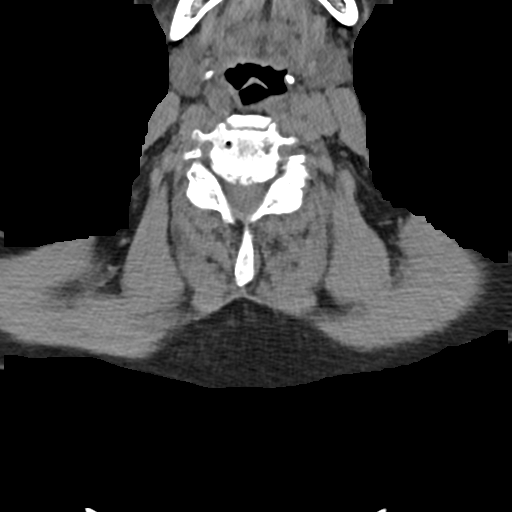
[im 46/82  soft-tissue]
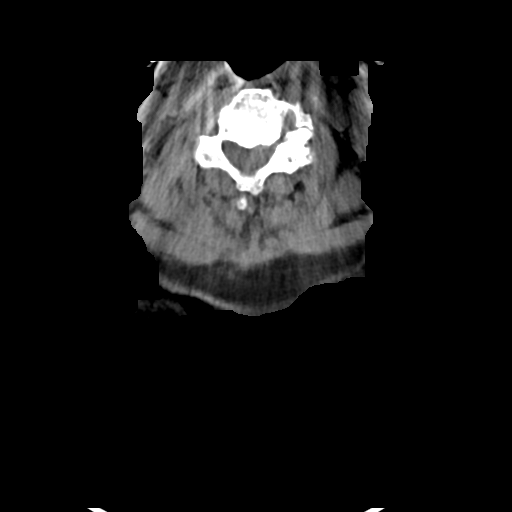
[im 55/82  soft-tissue]
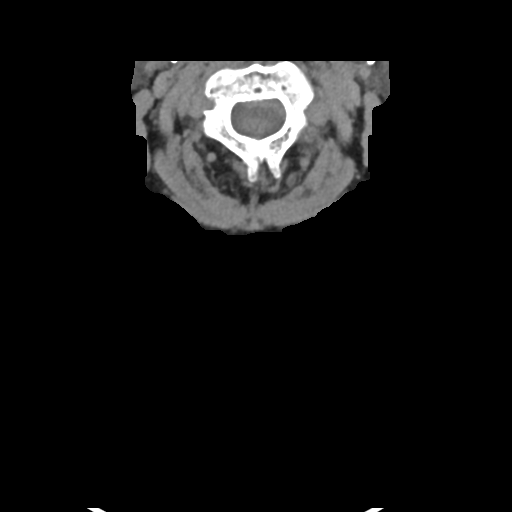
[im 64/82  soft-tissue]
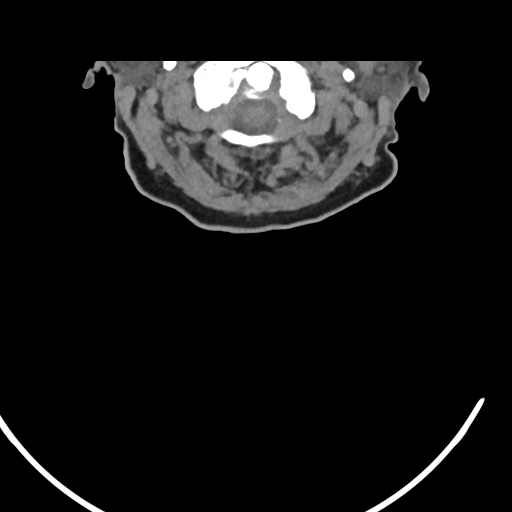
[im 73/82  soft-tissue]
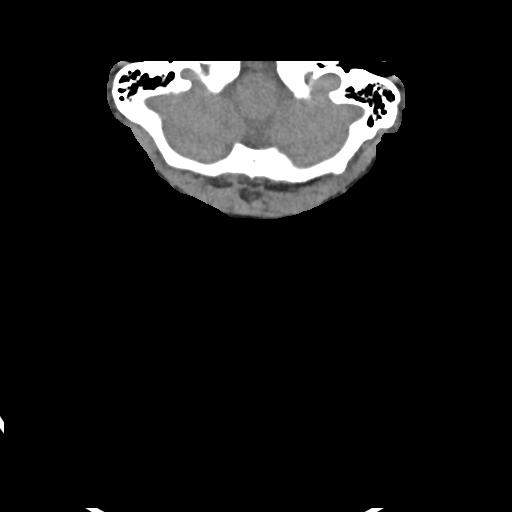

[10 of 33 positions shown; findings below may reference images not displayed]

FINDINGS: CT HEAD FINDINGS

Brain: No intracranial hemorrhage, mass effect or midline shift. No
acute cortical infarction. Stable calcified meningioma left parietal
region abutting the skull measures 8 mm. No surrounding mass effect.
Stable cerebral atrophy. Stable periventricular chronic white matter
disease.

Vascular: Atherosclerotic calcifications of vertebral arteries.

Skull: No skull fracture is noted.

Sinuses/Orbits: Paranasal sinuses and mastoid air cells are
unremarkable.

Other: None

CT CERVICAL SPINE FINDINGS

Alignment: There is normal alignment.

Skull base and vertebrae: No acute fracture or subluxation. There
are degenerative changes C1-C2 articulation. Mild anterior and mild
posterior spurring at C3-C4-C4-C5 C5-C6 and C6-C7 level. There is
mild to moderate anterior spurring at C7-T1 level. Mild anterior
spurring at T1-T2 level.

Soft tissues and spinal canal: No prevertebral soft tissue swelling.
Cervical airway is patent.

Disc levels: Disc space flattening with mild anterior mild posterior
spurring at C3-C4-C4-C5 C5-C6 level. Mild disc space flattening at
C6-C7 level.

Upper chest: The visualized lung apices shows no evidence of
pneumothorax.

Other: None
IMPRESSION: 1. No acute intracranial abnormality. Stable atrophy and chronic
white matter disease. No definite acute cortical infarction.
2. No cervical spine acute fracture or subluxation. Multilevel
degenerative changes as described above.

## 2017-07-06 DIAGNOSIS — C50919 Malignant neoplasm of unspecified site of unspecified female breast: Secondary | ICD-10-CM | POA: Diagnosis not present

## 2017-07-06 DIAGNOSIS — G459 Transient cerebral ischemic attack, unspecified: Secondary | ICD-10-CM | POA: Diagnosis not present

## 2017-07-06 DIAGNOSIS — I4891 Unspecified atrial fibrillation: Secondary | ICD-10-CM | POA: Diagnosis not present

## 2017-07-06 DIAGNOSIS — I951 Orthostatic hypotension: Secondary | ICD-10-CM | POA: Diagnosis not present

## 2017-07-08 DIAGNOSIS — Z23 Encounter for immunization: Secondary | ICD-10-CM | POA: Diagnosis not present

## 2017-07-19 DIAGNOSIS — M6289 Other specified disorders of muscle: Secondary | ICD-10-CM | POA: Diagnosis not present

## 2017-07-19 DIAGNOSIS — L905 Scar conditions and fibrosis of skin: Secondary | ICD-10-CM | POA: Diagnosis not present

## 2017-07-19 DIAGNOSIS — I890 Lymphedema, not elsewhere classified: Secondary | ICD-10-CM | POA: Diagnosis not present

## 2017-07-19 DIAGNOSIS — M6281 Muscle weakness (generalized): Secondary | ICD-10-CM | POA: Diagnosis not present

## 2017-07-27 DIAGNOSIS — M6281 Muscle weakness (generalized): Secondary | ICD-10-CM | POA: Diagnosis not present

## 2017-07-27 DIAGNOSIS — I890 Lymphedema, not elsewhere classified: Secondary | ICD-10-CM | POA: Diagnosis not present

## 2017-07-27 DIAGNOSIS — L905 Scar conditions and fibrosis of skin: Secondary | ICD-10-CM | POA: Diagnosis not present

## 2017-07-27 DIAGNOSIS — M6289 Other specified disorders of muscle: Secondary | ICD-10-CM | POA: Diagnosis not present

## 2017-08-02 DIAGNOSIS — M6281 Muscle weakness (generalized): Secondary | ICD-10-CM | POA: Diagnosis not present

## 2017-08-02 DIAGNOSIS — M6289 Other specified disorders of muscle: Secondary | ICD-10-CM | POA: Diagnosis not present

## 2017-08-02 DIAGNOSIS — I890 Lymphedema, not elsewhere classified: Secondary | ICD-10-CM | POA: Diagnosis not present

## 2017-08-02 DIAGNOSIS — L905 Scar conditions and fibrosis of skin: Secondary | ICD-10-CM | POA: Diagnosis not present

## 2017-08-04 DIAGNOSIS — Z170 Estrogen receptor positive status [ER+]: Secondary | ICD-10-CM | POA: Diagnosis not present

## 2017-08-04 DIAGNOSIS — C50812 Malignant neoplasm of overlapping sites of left female breast: Secondary | ICD-10-CM | POA: Diagnosis not present

## 2017-08-10 DIAGNOSIS — I890 Lymphedema, not elsewhere classified: Secondary | ICD-10-CM | POA: Diagnosis not present

## 2017-08-10 DIAGNOSIS — I4891 Unspecified atrial fibrillation: Secondary | ICD-10-CM | POA: Diagnosis not present

## 2017-08-10 DIAGNOSIS — I951 Orthostatic hypotension: Secondary | ICD-10-CM | POA: Diagnosis not present

## 2017-08-10 DIAGNOSIS — C50919 Malignant neoplasm of unspecified site of unspecified female breast: Secondary | ICD-10-CM | POA: Diagnosis not present

## 2017-08-11 DIAGNOSIS — L905 Scar conditions and fibrosis of skin: Secondary | ICD-10-CM | POA: Diagnosis not present

## 2017-08-11 DIAGNOSIS — M6289 Other specified disorders of muscle: Secondary | ICD-10-CM | POA: Diagnosis not present

## 2017-08-11 DIAGNOSIS — I890 Lymphedema, not elsewhere classified: Secondary | ICD-10-CM | POA: Diagnosis not present

## 2017-08-20 DIAGNOSIS — I10 Essential (primary) hypertension: Secondary | ICD-10-CM | POA: Diagnosis not present

## 2017-08-20 DIAGNOSIS — R079 Chest pain, unspecified: Secondary | ICD-10-CM | POA: Diagnosis not present

## 2017-08-20 DIAGNOSIS — R4020 Unspecified coma: Secondary | ICD-10-CM | POA: Diagnosis not present

## 2017-08-20 DIAGNOSIS — R42 Dizziness and giddiness: Secondary | ICD-10-CM | POA: Diagnosis not present

## 2017-08-20 DIAGNOSIS — R001 Bradycardia, unspecified: Secondary | ICD-10-CM | POA: Diagnosis not present

## 2017-08-20 DIAGNOSIS — R55 Syncope and collapse: Secondary | ICD-10-CM | POA: Diagnosis not present

## 2017-08-20 DIAGNOSIS — I4891 Unspecified atrial fibrillation: Secondary | ICD-10-CM | POA: Diagnosis not present

## 2017-08-23 DIAGNOSIS — H6123 Impacted cerumen, bilateral: Secondary | ICD-10-CM | POA: Diagnosis not present

## 2017-08-26 DIAGNOSIS — L905 Scar conditions and fibrosis of skin: Secondary | ICD-10-CM | POA: Diagnosis not present

## 2017-08-26 DIAGNOSIS — M6289 Other specified disorders of muscle: Secondary | ICD-10-CM | POA: Diagnosis not present

## 2017-08-26 DIAGNOSIS — I890 Lymphedema, not elsewhere classified: Secondary | ICD-10-CM | POA: Diagnosis not present

## 2017-09-08 DIAGNOSIS — I160 Hypertensive urgency: Secondary | ICD-10-CM | POA: Diagnosis not present

## 2017-09-08 DIAGNOSIS — Z9010 Acquired absence of unspecified breast and nipple: Secondary | ICD-10-CM | POA: Diagnosis not present

## 2017-09-08 DIAGNOSIS — Z853 Personal history of malignant neoplasm of breast: Secondary | ICD-10-CM | POA: Diagnosis not present

## 2017-09-08 DIAGNOSIS — G459 Transient cerebral ischemic attack, unspecified: Secondary | ICD-10-CM | POA: Diagnosis not present

## 2017-09-08 DIAGNOSIS — C50812 Malignant neoplasm of overlapping sites of left female breast: Secondary | ICD-10-CM | POA: Diagnosis not present

## 2017-09-08 DIAGNOSIS — R4701 Aphasia: Secondary | ICD-10-CM | POA: Diagnosis not present

## 2017-09-08 DIAGNOSIS — R42 Dizziness and giddiness: Secondary | ICD-10-CM | POA: Diagnosis not present

## 2017-09-08 DIAGNOSIS — I6789 Other cerebrovascular disease: Secondary | ICD-10-CM | POA: Diagnosis not present

## 2017-09-08 DIAGNOSIS — Z880 Allergy status to penicillin: Secondary | ICD-10-CM | POA: Diagnosis not present

## 2017-09-08 DIAGNOSIS — I480 Paroxysmal atrial fibrillation: Secondary | ICD-10-CM | POA: Diagnosis not present

## 2017-09-08 DIAGNOSIS — R4789 Other speech disturbances: Secondary | ICD-10-CM | POA: Diagnosis not present

## 2017-09-08 DIAGNOSIS — Z170 Estrogen receptor positive status [ER+]: Secondary | ICD-10-CM | POA: Diagnosis not present

## 2017-09-08 DIAGNOSIS — I890 Lymphedema, not elsewhere classified: Secondary | ICD-10-CM | POA: Diagnosis not present

## 2017-09-08 DIAGNOSIS — R55 Syncope and collapse: Secondary | ICD-10-CM | POA: Diagnosis not present

## 2017-09-08 DIAGNOSIS — R52 Pain, unspecified: Secondary | ICD-10-CM | POA: Diagnosis not present

## 2017-09-08 DIAGNOSIS — R001 Bradycardia, unspecified: Secondary | ICD-10-CM | POA: Diagnosis not present

## 2017-09-08 DIAGNOSIS — R410 Disorientation, unspecified: Secondary | ICD-10-CM | POA: Diagnosis not present

## 2017-09-08 DIAGNOSIS — Z79899 Other long term (current) drug therapy: Secondary | ICD-10-CM | POA: Diagnosis not present

## 2017-09-08 DIAGNOSIS — Z882 Allergy status to sulfonamides status: Secondary | ICD-10-CM | POA: Diagnosis not present

## 2017-09-08 DIAGNOSIS — I951 Orthostatic hypotension: Secondary | ICD-10-CM | POA: Diagnosis not present

## 2017-09-09 DIAGNOSIS — R001 Bradycardia, unspecified: Secondary | ICD-10-CM | POA: Diagnosis not present

## 2017-09-09 DIAGNOSIS — I480 Paroxysmal atrial fibrillation: Secondary | ICD-10-CM | POA: Diagnosis not present

## 2017-09-09 DIAGNOSIS — R4789 Other speech disturbances: Secondary | ICD-10-CM | POA: Diagnosis not present

## 2017-09-09 DIAGNOSIS — I890 Lymphedema, not elsewhere classified: Secondary | ICD-10-CM | POA: Diagnosis not present

## 2017-09-09 DIAGNOSIS — G459 Transient cerebral ischemic attack, unspecified: Secondary | ICD-10-CM | POA: Diagnosis not present

## 2017-09-09 DIAGNOSIS — C50812 Malignant neoplasm of overlapping sites of left female breast: Secondary | ICD-10-CM | POA: Diagnosis not present

## 2017-09-09 DIAGNOSIS — I951 Orthostatic hypotension: Secondary | ICD-10-CM | POA: Diagnosis not present

## 2017-09-09 DIAGNOSIS — I10 Essential (primary) hypertension: Secondary | ICD-10-CM | POA: Diagnosis not present

## 2017-09-09 DIAGNOSIS — Z170 Estrogen receptor positive status [ER+]: Secondary | ICD-10-CM | POA: Diagnosis not present

## 2017-09-13 DIAGNOSIS — I890 Lymphedema, not elsewhere classified: Secondary | ICD-10-CM | POA: Diagnosis not present

## 2017-09-13 DIAGNOSIS — R4020 Unspecified coma: Secondary | ICD-10-CM | POA: Diagnosis not present

## 2017-09-13 DIAGNOSIS — Z170 Estrogen receptor positive status [ER+]: Secondary | ICD-10-CM | POA: Diagnosis not present

## 2017-09-13 DIAGNOSIS — C50812 Malignant neoplasm of overlapping sites of left female breast: Secondary | ICD-10-CM | POA: Diagnosis not present

## 2017-09-14 DIAGNOSIS — I890 Lymphedema, not elsewhere classified: Secondary | ICD-10-CM | POA: Diagnosis not present

## 2017-09-16 DIAGNOSIS — I10 Essential (primary) hypertension: Secondary | ICD-10-CM | POA: Diagnosis not present

## 2017-09-16 DIAGNOSIS — R001 Bradycardia, unspecified: Secondary | ICD-10-CM | POA: Diagnosis not present

## 2017-09-16 DIAGNOSIS — I4891 Unspecified atrial fibrillation: Secondary | ICD-10-CM | POA: Diagnosis not present

## 2017-09-16 DIAGNOSIS — R4020 Unspecified coma: Secondary | ICD-10-CM | POA: Diagnosis not present

## 2017-09-21 DIAGNOSIS — C50919 Malignant neoplasm of unspecified site of unspecified female breast: Secondary | ICD-10-CM | POA: Diagnosis not present

## 2017-09-21 DIAGNOSIS — G459 Transient cerebral ischemic attack, unspecified: Secondary | ICD-10-CM | POA: Diagnosis not present

## 2017-09-21 DIAGNOSIS — I4891 Unspecified atrial fibrillation: Secondary | ICD-10-CM | POA: Diagnosis not present

## 2017-09-21 DIAGNOSIS — I951 Orthostatic hypotension: Secondary | ICD-10-CM | POA: Diagnosis not present

## 2017-09-23 DIAGNOSIS — I890 Lymphedema, not elsewhere classified: Secondary | ICD-10-CM | POA: Diagnosis not present

## 2017-09-24 DIAGNOSIS — M9903 Segmental and somatic dysfunction of lumbar region: Secondary | ICD-10-CM | POA: Diagnosis not present

## 2017-09-24 DIAGNOSIS — M9904 Segmental and somatic dysfunction of sacral region: Secondary | ICD-10-CM | POA: Diagnosis not present

## 2017-09-24 DIAGNOSIS — M545 Low back pain: Secondary | ICD-10-CM | POA: Diagnosis not present

## 2017-09-24 DIAGNOSIS — M729 Fibroblastic disorder, unspecified: Secondary | ICD-10-CM | POA: Diagnosis not present

## 2017-09-29 DIAGNOSIS — I890 Lymphedema, not elsewhere classified: Secondary | ICD-10-CM | POA: Diagnosis not present

## 2017-10-05 DIAGNOSIS — I890 Lymphedema, not elsewhere classified: Secondary | ICD-10-CM | POA: Diagnosis not present

## 2017-10-13 DIAGNOSIS — G629 Polyneuropathy, unspecified: Secondary | ICD-10-CM | POA: Diagnosis not present

## 2017-10-13 DIAGNOSIS — I951 Orthostatic hypotension: Secondary | ICD-10-CM | POA: Diagnosis not present

## 2017-10-13 DIAGNOSIS — G459 Transient cerebral ischemic attack, unspecified: Secondary | ICD-10-CM | POA: Diagnosis not present

## 2017-10-13 DIAGNOSIS — I4891 Unspecified atrial fibrillation: Secondary | ICD-10-CM | POA: Diagnosis not present

## 2017-10-14 DIAGNOSIS — L821 Other seborrheic keratosis: Secondary | ICD-10-CM | POA: Diagnosis not present

## 2017-10-14 DIAGNOSIS — L570 Actinic keratosis: Secondary | ICD-10-CM | POA: Diagnosis not present

## 2017-10-14 DIAGNOSIS — L718 Other rosacea: Secondary | ICD-10-CM | POA: Diagnosis not present

## 2017-10-14 DIAGNOSIS — Z85828 Personal history of other malignant neoplasm of skin: Secondary | ICD-10-CM | POA: Diagnosis not present

## 2017-10-19 DIAGNOSIS — H16143 Punctate keratitis, bilateral: Secondary | ICD-10-CM | POA: Diagnosis not present

## 2017-10-19 DIAGNOSIS — H353132 Nonexudative age-related macular degeneration, bilateral, intermediate dry stage: Secondary | ICD-10-CM | POA: Diagnosis not present

## 2017-10-28 DIAGNOSIS — H353190 Nonexudative age-related macular degeneration, unspecified eye, stage unspecified: Secondary | ICD-10-CM | POA: Diagnosis not present

## 2017-10-28 DIAGNOSIS — I4891 Unspecified atrial fibrillation: Secondary | ICD-10-CM | POA: Diagnosis not present

## 2017-10-28 DIAGNOSIS — G629 Polyneuropathy, unspecified: Secondary | ICD-10-CM | POA: Diagnosis not present

## 2017-10-28 DIAGNOSIS — I951 Orthostatic hypotension: Secondary | ICD-10-CM | POA: Diagnosis not present

## 2017-11-04 DIAGNOSIS — R001 Bradycardia, unspecified: Secondary | ICD-10-CM | POA: Diagnosis not present

## 2017-11-04 DIAGNOSIS — I4891 Unspecified atrial fibrillation: Secondary | ICD-10-CM | POA: Diagnosis not present

## 2017-11-04 DIAGNOSIS — R55 Syncope and collapse: Secondary | ICD-10-CM | POA: Diagnosis not present

## 2017-11-11 DIAGNOSIS — R55 Syncope and collapse: Secondary | ICD-10-CM | POA: Diagnosis not present

## 2017-11-11 DIAGNOSIS — R001 Bradycardia, unspecified: Secondary | ICD-10-CM | POA: Diagnosis not present

## 2017-11-11 DIAGNOSIS — I951 Orthostatic hypotension: Secondary | ICD-10-CM | POA: Diagnosis not present

## 2017-11-11 DIAGNOSIS — M48061 Spinal stenosis, lumbar region without neurogenic claudication: Secondary | ICD-10-CM | POA: Diagnosis not present

## 2017-11-11 DIAGNOSIS — G4700 Insomnia, unspecified: Secondary | ICD-10-CM | POA: Diagnosis not present

## 2017-11-11 DIAGNOSIS — I4891 Unspecified atrial fibrillation: Secondary | ICD-10-CM | POA: Diagnosis not present

## 2017-11-12 DIAGNOSIS — R001 Bradycardia, unspecified: Secondary | ICD-10-CM | POA: Diagnosis not present

## 2017-11-12 DIAGNOSIS — S0990XA Unspecified injury of head, initial encounter: Secondary | ICD-10-CM | POA: Diagnosis not present

## 2017-11-12 DIAGNOSIS — R55 Syncope and collapse: Secondary | ICD-10-CM | POA: Diagnosis not present

## 2017-11-12 DIAGNOSIS — I951 Orthostatic hypotension: Secondary | ICD-10-CM | POA: Diagnosis not present

## 2017-11-12 DIAGNOSIS — Z741 Need for assistance with personal care: Secondary | ICD-10-CM | POA: Diagnosis not present

## 2017-11-12 DIAGNOSIS — I4891 Unspecified atrial fibrillation: Secondary | ICD-10-CM | POA: Diagnosis not present

## 2017-11-12 DIAGNOSIS — S0101XA Laceration without foreign body of scalp, initial encounter: Secondary | ICD-10-CM | POA: Diagnosis not present

## 2017-11-12 DIAGNOSIS — M6281 Muscle weakness (generalized): Secondary | ICD-10-CM | POA: Diagnosis not present

## 2017-11-12 DIAGNOSIS — W1839XA Other fall on same level, initial encounter: Secondary | ICD-10-CM | POA: Diagnosis not present

## 2017-11-12 DIAGNOSIS — W19XXXA Unspecified fall, initial encounter: Secondary | ICD-10-CM | POA: Diagnosis not present

## 2017-11-12 DIAGNOSIS — S0101XD Laceration without foreign body of scalp, subsequent encounter: Secondary | ICD-10-CM | POA: Diagnosis not present

## 2017-11-12 DIAGNOSIS — R42 Dizziness and giddiness: Secondary | ICD-10-CM | POA: Diagnosis not present

## 2017-11-12 DIAGNOSIS — S0180XA Unspecified open wound of other part of head, initial encounter: Secondary | ICD-10-CM | POA: Diagnosis not present

## 2017-11-12 DIAGNOSIS — Z79899 Other long term (current) drug therapy: Secondary | ICD-10-CM | POA: Diagnosis not present

## 2017-11-12 DIAGNOSIS — Z5181 Encounter for therapeutic drug level monitoring: Secondary | ICD-10-CM | POA: Diagnosis not present

## 2017-11-12 DIAGNOSIS — R296 Repeated falls: Secondary | ICD-10-CM | POA: Diagnosis not present

## 2017-11-12 DIAGNOSIS — R110 Nausea: Secondary | ICD-10-CM | POA: Diagnosis not present

## 2017-11-12 DIAGNOSIS — R2681 Unsteadiness on feet: Secondary | ICD-10-CM | POA: Diagnosis not present

## 2017-11-12 DIAGNOSIS — Z9181 History of falling: Secondary | ICD-10-CM | POA: Diagnosis not present

## 2017-11-12 DIAGNOSIS — S0000XS Unspecified superficial injury of scalp, sequela: Secondary | ICD-10-CM | POA: Diagnosis not present

## 2017-11-13 DIAGNOSIS — R55 Syncope and collapse: Secondary | ICD-10-CM | POA: Diagnosis not present

## 2017-11-13 DIAGNOSIS — S0000XS Unspecified superficial injury of scalp, sequela: Secondary | ICD-10-CM | POA: Diagnosis not present

## 2017-11-13 DIAGNOSIS — I4891 Unspecified atrial fibrillation: Secondary | ICD-10-CM | POA: Diagnosis not present

## 2017-11-13 DIAGNOSIS — R2681 Unsteadiness on feet: Secondary | ICD-10-CM | POA: Diagnosis not present

## 2017-11-13 DIAGNOSIS — R001 Bradycardia, unspecified: Secondary | ICD-10-CM | POA: Diagnosis not present

## 2017-11-13 DIAGNOSIS — M6281 Muscle weakness (generalized): Secondary | ICD-10-CM | POA: Diagnosis not present

## 2017-11-14 DIAGNOSIS — R2681 Unsteadiness on feet: Secondary | ICD-10-CM | POA: Diagnosis not present

## 2017-11-14 DIAGNOSIS — M6281 Muscle weakness (generalized): Secondary | ICD-10-CM | POA: Diagnosis not present

## 2017-11-14 DIAGNOSIS — R001 Bradycardia, unspecified: Secondary | ICD-10-CM | POA: Diagnosis not present

## 2017-11-14 DIAGNOSIS — S0000XS Unspecified superficial injury of scalp, sequela: Secondary | ICD-10-CM | POA: Diagnosis not present

## 2017-11-14 DIAGNOSIS — R55 Syncope and collapse: Secondary | ICD-10-CM | POA: Diagnosis not present

## 2017-11-14 DIAGNOSIS — I4891 Unspecified atrial fibrillation: Secondary | ICD-10-CM | POA: Diagnosis not present

## 2017-11-15 DIAGNOSIS — M6281 Muscle weakness (generalized): Secondary | ICD-10-CM | POA: Diagnosis not present

## 2017-11-15 DIAGNOSIS — R2681 Unsteadiness on feet: Secondary | ICD-10-CM | POA: Diagnosis not present

## 2017-11-15 DIAGNOSIS — R001 Bradycardia, unspecified: Secondary | ICD-10-CM | POA: Diagnosis not present

## 2017-11-15 DIAGNOSIS — S0000XS Unspecified superficial injury of scalp, sequela: Secondary | ICD-10-CM | POA: Diagnosis not present

## 2017-11-15 DIAGNOSIS — I4891 Unspecified atrial fibrillation: Secondary | ICD-10-CM | POA: Diagnosis not present

## 2017-11-15 DIAGNOSIS — S0101XD Laceration without foreign body of scalp, subsequent encounter: Secondary | ICD-10-CM | POA: Diagnosis not present

## 2017-11-15 DIAGNOSIS — R55 Syncope and collapse: Secondary | ICD-10-CM | POA: Diagnosis not present

## 2017-11-16 DIAGNOSIS — I4891 Unspecified atrial fibrillation: Secondary | ICD-10-CM | POA: Diagnosis not present

## 2017-11-16 DIAGNOSIS — R001 Bradycardia, unspecified: Secondary | ICD-10-CM | POA: Diagnosis not present

## 2017-11-16 DIAGNOSIS — M6281 Muscle weakness (generalized): Secondary | ICD-10-CM | POA: Diagnosis not present

## 2017-11-16 DIAGNOSIS — R2681 Unsteadiness on feet: Secondary | ICD-10-CM | POA: Diagnosis not present

## 2017-11-16 DIAGNOSIS — R55 Syncope and collapse: Secondary | ICD-10-CM | POA: Diagnosis not present

## 2017-11-16 DIAGNOSIS — S0000XS Unspecified superficial injury of scalp, sequela: Secondary | ICD-10-CM | POA: Diagnosis not present

## 2017-11-22 DIAGNOSIS — R252 Cramp and spasm: Secondary | ICD-10-CM | POA: Diagnosis not present

## 2017-11-22 DIAGNOSIS — M729 Fibroblastic disorder, unspecified: Secondary | ICD-10-CM | POA: Diagnosis not present

## 2017-11-22 DIAGNOSIS — S0101XD Laceration without foreign body of scalp, subsequent encounter: Secondary | ICD-10-CM | POA: Diagnosis not present

## 2017-11-22 DIAGNOSIS — M9905 Segmental and somatic dysfunction of pelvic region: Secondary | ICD-10-CM | POA: Diagnosis not present

## 2017-11-22 DIAGNOSIS — M9904 Segmental and somatic dysfunction of sacral region: Secondary | ICD-10-CM | POA: Diagnosis not present

## 2017-11-22 DIAGNOSIS — I4891 Unspecified atrial fibrillation: Secondary | ICD-10-CM | POA: Diagnosis not present

## 2017-11-22 DIAGNOSIS — M545 Low back pain: Secondary | ICD-10-CM | POA: Diagnosis not present

## 2017-11-22 DIAGNOSIS — G629 Polyneuropathy, unspecified: Secondary | ICD-10-CM | POA: Diagnosis not present

## 2017-11-25 DIAGNOSIS — M79604 Pain in right leg: Secondary | ICD-10-CM | POA: Diagnosis not present

## 2017-11-25 DIAGNOSIS — M79605 Pain in left leg: Secondary | ICD-10-CM | POA: Diagnosis not present

## 2017-11-25 DIAGNOSIS — S40022A Contusion of left upper arm, initial encounter: Secondary | ICD-10-CM | POA: Diagnosis not present

## 2017-12-02 DIAGNOSIS — Z9181 History of falling: Secondary | ICD-10-CM | POA: Diagnosis not present

## 2017-12-02 DIAGNOSIS — G629 Polyneuropathy, unspecified: Secondary | ICD-10-CM | POA: Diagnosis not present

## 2017-12-02 DIAGNOSIS — I4891 Unspecified atrial fibrillation: Secondary | ICD-10-CM | POA: Diagnosis not present

## 2017-12-06 DIAGNOSIS — G9009 Other idiopathic peripheral autonomic neuropathy: Secondary | ICD-10-CM | POA: Diagnosis not present

## 2017-12-06 DIAGNOSIS — G629 Polyneuropathy, unspecified: Secondary | ICD-10-CM | POA: Diagnosis not present

## 2017-12-06 DIAGNOSIS — R5383 Other fatigue: Secondary | ICD-10-CM | POA: Diagnosis not present

## 2017-12-06 DIAGNOSIS — R5381 Other malaise: Secondary | ICD-10-CM | POA: Diagnosis not present

## 2017-12-16 DIAGNOSIS — I4891 Unspecified atrial fibrillation: Secondary | ICD-10-CM | POA: Diagnosis not present

## 2017-12-16 DIAGNOSIS — M6281 Muscle weakness (generalized): Secondary | ICD-10-CM | POA: Diagnosis not present

## 2017-12-16 DIAGNOSIS — Z9181 History of falling: Secondary | ICD-10-CM | POA: Diagnosis not present

## 2017-12-16 DIAGNOSIS — R2681 Unsteadiness on feet: Secondary | ICD-10-CM | POA: Diagnosis not present

## 2017-12-16 DIAGNOSIS — G629 Polyneuropathy, unspecified: Secondary | ICD-10-CM | POA: Diagnosis not present

## 2017-12-20 ENCOUNTER — Other Ambulatory Visit: Payer: Self-pay

## 2017-12-20 DIAGNOSIS — I4891 Unspecified atrial fibrillation: Secondary | ICD-10-CM | POA: Diagnosis not present

## 2017-12-20 DIAGNOSIS — M6281 Muscle weakness (generalized): Secondary | ICD-10-CM | POA: Diagnosis not present

## 2017-12-20 DIAGNOSIS — G629 Polyneuropathy, unspecified: Secondary | ICD-10-CM | POA: Diagnosis not present

## 2017-12-20 DIAGNOSIS — Z9181 History of falling: Secondary | ICD-10-CM | POA: Diagnosis not present

## 2017-12-20 DIAGNOSIS — R2681 Unsteadiness on feet: Secondary | ICD-10-CM | POA: Diagnosis not present

## 2017-12-24 DIAGNOSIS — M6281 Muscle weakness (generalized): Secondary | ICD-10-CM | POA: Diagnosis not present

## 2017-12-24 DIAGNOSIS — I4891 Unspecified atrial fibrillation: Secondary | ICD-10-CM | POA: Diagnosis not present

## 2017-12-24 DIAGNOSIS — G629 Polyneuropathy, unspecified: Secondary | ICD-10-CM | POA: Diagnosis not present

## 2017-12-24 DIAGNOSIS — Z9181 History of falling: Secondary | ICD-10-CM | POA: Diagnosis not present

## 2017-12-24 DIAGNOSIS — R2681 Unsteadiness on feet: Secondary | ICD-10-CM | POA: Diagnosis not present

## 2017-12-27 DIAGNOSIS — M6281 Muscle weakness (generalized): Secondary | ICD-10-CM | POA: Diagnosis not present

## 2017-12-27 DIAGNOSIS — Z9181 History of falling: Secondary | ICD-10-CM | POA: Diagnosis not present

## 2017-12-27 DIAGNOSIS — G629 Polyneuropathy, unspecified: Secondary | ICD-10-CM | POA: Diagnosis not present

## 2017-12-27 DIAGNOSIS — R2681 Unsteadiness on feet: Secondary | ICD-10-CM | POA: Diagnosis not present

## 2017-12-27 DIAGNOSIS — I4891 Unspecified atrial fibrillation: Secondary | ICD-10-CM | POA: Diagnosis not present

## 2017-12-29 DIAGNOSIS — I4891 Unspecified atrial fibrillation: Secondary | ICD-10-CM | POA: Diagnosis not present

## 2017-12-29 DIAGNOSIS — R2681 Unsteadiness on feet: Secondary | ICD-10-CM | POA: Diagnosis not present

## 2017-12-29 DIAGNOSIS — G629 Polyneuropathy, unspecified: Secondary | ICD-10-CM | POA: Diagnosis not present

## 2017-12-29 DIAGNOSIS — M6281 Muscle weakness (generalized): Secondary | ICD-10-CM | POA: Diagnosis not present

## 2017-12-29 DIAGNOSIS — Z9181 History of falling: Secondary | ICD-10-CM | POA: Diagnosis not present

## 2017-12-31 DIAGNOSIS — Z7901 Long term (current) use of anticoagulants: Secondary | ICD-10-CM | POA: Diagnosis not present

## 2017-12-31 DIAGNOSIS — R42 Dizziness and giddiness: Secondary | ICD-10-CM | POA: Diagnosis not present

## 2017-12-31 DIAGNOSIS — Z8673 Personal history of transient ischemic attack (TIA), and cerebral infarction without residual deficits: Secondary | ICD-10-CM | POA: Diagnosis not present

## 2017-12-31 DIAGNOSIS — Z006 Encounter for examination for normal comparison and control in clinical research program: Secondary | ICD-10-CM | POA: Diagnosis not present

## 2017-12-31 DIAGNOSIS — F809 Developmental disorder of speech and language, unspecified: Secondary | ICD-10-CM | POA: Diagnosis not present

## 2017-12-31 DIAGNOSIS — J90 Pleural effusion, not elsewhere classified: Secondary | ICD-10-CM | POA: Diagnosis not present

## 2017-12-31 DIAGNOSIS — Z880 Allergy status to penicillin: Secondary | ICD-10-CM | POA: Diagnosis not present

## 2017-12-31 DIAGNOSIS — R55 Syncope and collapse: Secondary | ICD-10-CM | POA: Diagnosis not present

## 2017-12-31 DIAGNOSIS — Z79899 Other long term (current) drug therapy: Secondary | ICD-10-CM | POA: Diagnosis not present

## 2017-12-31 DIAGNOSIS — R479 Unspecified speech disturbances: Secondary | ICD-10-CM | POA: Diagnosis not present

## 2017-12-31 DIAGNOSIS — J9811 Atelectasis: Secondary | ICD-10-CM | POA: Diagnosis not present

## 2017-12-31 DIAGNOSIS — R001 Bradycardia, unspecified: Secondary | ICD-10-CM | POA: Diagnosis not present

## 2017-12-31 DIAGNOSIS — Z853 Personal history of malignant neoplasm of breast: Secondary | ICD-10-CM | POA: Diagnosis not present

## 2017-12-31 DIAGNOSIS — I481 Persistent atrial fibrillation: Secondary | ICD-10-CM | POA: Diagnosis not present

## 2017-12-31 DIAGNOSIS — I10 Essential (primary) hypertension: Secondary | ICD-10-CM | POA: Diagnosis not present

## 2017-12-31 DIAGNOSIS — Z9013 Acquired absence of bilateral breasts and nipples: Secondary | ICD-10-CM | POA: Diagnosis not present

## 2017-12-31 DIAGNOSIS — I480 Paroxysmal atrial fibrillation: Secondary | ICD-10-CM | POA: Diagnosis not present

## 2017-12-31 DIAGNOSIS — Z85828 Personal history of other malignant neoplasm of skin: Secondary | ICD-10-CM | POA: Diagnosis not present

## 2017-12-31 DIAGNOSIS — R4020 Unspecified coma: Secondary | ICD-10-CM | POA: Diagnosis not present

## 2018-01-03 DIAGNOSIS — I10 Essential (primary) hypertension: Secondary | ICD-10-CM | POA: Diagnosis not present

## 2018-01-03 DIAGNOSIS — I4891 Unspecified atrial fibrillation: Secondary | ICD-10-CM | POA: Diagnosis not present

## 2018-01-03 DIAGNOSIS — R4781 Slurred speech: Secondary | ICD-10-CM | POA: Diagnosis not present

## 2018-01-03 DIAGNOSIS — G459 Transient cerebral ischemic attack, unspecified: Secondary | ICD-10-CM | POA: Diagnosis not present

## 2018-01-03 DIAGNOSIS — R4701 Aphasia: Secondary | ICD-10-CM | POA: Diagnosis not present

## 2018-01-03 DIAGNOSIS — R479 Unspecified speech disturbances: Secondary | ICD-10-CM | POA: Diagnosis not present

## 2018-01-03 DIAGNOSIS — J984 Other disorders of lung: Secondary | ICD-10-CM | POA: Diagnosis not present

## 2018-01-03 DIAGNOSIS — N390 Urinary tract infection, site not specified: Secondary | ICD-10-CM | POA: Diagnosis not present

## 2018-01-03 DIAGNOSIS — R42 Dizziness and giddiness: Secondary | ICD-10-CM | POA: Diagnosis not present

## 2018-01-04 DIAGNOSIS — B9620 Unspecified Escherichia coli [E. coli] as the cause of diseases classified elsewhere: Secondary | ICD-10-CM | POA: Diagnosis present

## 2018-01-04 DIAGNOSIS — Z8673 Personal history of transient ischemic attack (TIA), and cerebral infarction without residual deficits: Secondary | ICD-10-CM | POA: Diagnosis not present

## 2018-01-04 DIAGNOSIS — I10 Essential (primary) hypertension: Secondary | ICD-10-CM | POA: Diagnosis present

## 2018-01-04 DIAGNOSIS — Z85828 Personal history of other malignant neoplasm of skin: Secondary | ICD-10-CM | POA: Diagnosis not present

## 2018-01-04 DIAGNOSIS — R1312 Dysphagia, oropharyngeal phase: Secondary | ICD-10-CM | POA: Diagnosis not present

## 2018-01-04 DIAGNOSIS — Z853 Personal history of malignant neoplasm of breast: Secondary | ICD-10-CM | POA: Diagnosis not present

## 2018-01-04 DIAGNOSIS — I4891 Unspecified atrial fibrillation: Secondary | ICD-10-CM | POA: Diagnosis not present

## 2018-01-04 DIAGNOSIS — Z7901 Long term (current) use of anticoagulants: Secondary | ICD-10-CM | POA: Diagnosis not present

## 2018-01-04 DIAGNOSIS — N390 Urinary tract infection, site not specified: Secondary | ICD-10-CM | POA: Diagnosis present

## 2018-01-04 DIAGNOSIS — Z66 Do not resuscitate: Secondary | ICD-10-CM | POA: Diagnosis present

## 2018-01-04 DIAGNOSIS — R4781 Slurred speech: Secondary | ICD-10-CM | POA: Diagnosis not present

## 2018-01-04 DIAGNOSIS — R4701 Aphasia: Secondary | ICD-10-CM | POA: Diagnosis not present

## 2018-01-04 DIAGNOSIS — G459 Transient cerebral ischemic attack, unspecified: Secondary | ICD-10-CM | POA: Diagnosis not present

## 2018-01-07 DIAGNOSIS — G459 Transient cerebral ischemic attack, unspecified: Secondary | ICD-10-CM | POA: Diagnosis not present

## 2018-01-07 DIAGNOSIS — Z95818 Presence of other cardiac implants and grafts: Secondary | ICD-10-CM | POA: Diagnosis not present

## 2018-01-07 DIAGNOSIS — I4891 Unspecified atrial fibrillation: Secondary | ICD-10-CM | POA: Diagnosis not present

## 2018-01-07 DIAGNOSIS — N3000 Acute cystitis without hematuria: Secondary | ICD-10-CM | POA: Diagnosis not present

## 2018-01-18 DIAGNOSIS — I4891 Unspecified atrial fibrillation: Secondary | ICD-10-CM | POA: Diagnosis not present

## 2018-01-18 DIAGNOSIS — R079 Chest pain, unspecified: Secondary | ICD-10-CM | POA: Diagnosis not present

## 2018-01-18 DIAGNOSIS — R001 Bradycardia, unspecified: Secondary | ICD-10-CM | POA: Diagnosis not present

## 2018-01-18 DIAGNOSIS — I480 Paroxysmal atrial fibrillation: Secondary | ICD-10-CM | POA: Diagnosis not present

## 2018-01-18 DIAGNOSIS — I10 Essential (primary) hypertension: Secondary | ICD-10-CM | POA: Diagnosis not present

## 2018-01-19 DIAGNOSIS — R030 Elevated blood-pressure reading, without diagnosis of hypertension: Secondary | ICD-10-CM | POA: Diagnosis not present

## 2018-01-19 DIAGNOSIS — R300 Dysuria: Secondary | ICD-10-CM | POA: Diagnosis not present

## 2018-01-19 DIAGNOSIS — I4891 Unspecified atrial fibrillation: Secondary | ICD-10-CM | POA: Diagnosis not present

## 2018-01-19 DIAGNOSIS — T45525D Adverse effect of antithrombotic drugs, subsequent encounter: Secondary | ICD-10-CM | POA: Diagnosis not present

## 2018-01-19 DIAGNOSIS — R350 Frequency of micturition: Secondary | ICD-10-CM | POA: Diagnosis not present

## 2018-01-20 DIAGNOSIS — L570 Actinic keratosis: Secondary | ICD-10-CM | POA: Diagnosis not present

## 2018-01-20 DIAGNOSIS — L718 Other rosacea: Secondary | ICD-10-CM | POA: Diagnosis not present

## 2018-01-20 DIAGNOSIS — L820 Inflamed seborrheic keratosis: Secondary | ICD-10-CM | POA: Diagnosis not present

## 2018-01-20 DIAGNOSIS — L814 Other melanin hyperpigmentation: Secondary | ICD-10-CM | POA: Diagnosis not present

## 2018-01-29 DIAGNOSIS — R079 Chest pain, unspecified: Secondary | ICD-10-CM | POA: Diagnosis not present

## 2018-01-29 DIAGNOSIS — M542 Cervicalgia: Secondary | ICD-10-CM | POA: Diagnosis not present

## 2018-01-29 DIAGNOSIS — R110 Nausea: Secondary | ICD-10-CM | POA: Diagnosis not present

## 2018-01-29 DIAGNOSIS — R51 Headache: Secondary | ICD-10-CM | POA: Diagnosis not present

## 2018-01-29 DIAGNOSIS — J90 Pleural effusion, not elsewhere classified: Secondary | ICD-10-CM | POA: Diagnosis not present

## 2018-01-29 DIAGNOSIS — Z7901 Long term (current) use of anticoagulants: Secondary | ICD-10-CM | POA: Diagnosis not present

## 2018-01-29 DIAGNOSIS — M25512 Pain in left shoulder: Secondary | ICD-10-CM | POA: Diagnosis not present

## 2018-01-29 DIAGNOSIS — Z9013 Acquired absence of bilateral breasts and nipples: Secondary | ICD-10-CM | POA: Diagnosis not present

## 2018-01-29 DIAGNOSIS — Z8673 Personal history of transient ischemic attack (TIA), and cerebral infarction without residual deficits: Secondary | ICD-10-CM | POA: Diagnosis not present

## 2018-01-29 DIAGNOSIS — Z79899 Other long term (current) drug therapy: Secondary | ICD-10-CM | POA: Diagnosis not present

## 2018-01-29 DIAGNOSIS — I4891 Unspecified atrial fibrillation: Secondary | ICD-10-CM | POA: Diagnosis not present

## 2018-01-29 DIAGNOSIS — R05 Cough: Secondary | ICD-10-CM | POA: Diagnosis not present

## 2018-01-29 DIAGNOSIS — R0789 Other chest pain: Secondary | ICD-10-CM | POA: Diagnosis not present

## 2018-01-29 DIAGNOSIS — Z5181 Encounter for therapeutic drug level monitoring: Secondary | ICD-10-CM | POA: Diagnosis not present

## 2018-02-10 DIAGNOSIS — I480 Paroxysmal atrial fibrillation: Secondary | ICD-10-CM | POA: Diagnosis not present
